# Patient Record
Sex: Female | Born: 2000 | Hispanic: No | Marital: Single | Smoking: Never smoker
Health system: Southern US, Community
[De-identification: ages and names within clinical notes are randomized; demographics above are authoritative.]

## PROBLEM LIST (undated history)

## (undated) ENCOUNTER — Emergency Department (HOSPITAL_COMMUNITY): Source: Home / Self Care

## (undated) ENCOUNTER — Inpatient Hospital Stay (HOSPITAL_COMMUNITY): Payer: Self-pay

## (undated) DIAGNOSIS — R519 Headache, unspecified: Secondary | ICD-10-CM

## (undated) DIAGNOSIS — N83209 Unspecified ovarian cyst, unspecified side: Secondary | ICD-10-CM

## (undated) HISTORY — PX: SPINE SURGERY: SHX786

## (undated) HISTORY — DX: Headache, unspecified: R51.9

---
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
## 2019-01-13 DIAGNOSIS — Q76415 Congenital kyphosis, thoracolumbar region: Secondary | ICD-10-CM | POA: Insufficient documentation

## 2020-08-16 ENCOUNTER — Other Ambulatory Visit: Payer: Medicaid Other

## 2020-08-16 DIAGNOSIS — Z20822 Contact with and (suspected) exposure to covid-19: Secondary | ICD-10-CM

## 2020-08-17 LAB — NOVEL CORONAVIRUS, NAA: SARS-CoV-2, NAA: DETECTED — AB

## 2020-08-17 LAB — SARS-COV-2, NAA 2 DAY TAT

## 2021-08-06 ENCOUNTER — Other Ambulatory Visit: Payer: Self-pay | Admitting: Nurse Practitioner

## 2021-08-07 ENCOUNTER — Encounter: Payer: Medicaid Other | Admitting: Nurse Practitioner

## 2022-06-30 ENCOUNTER — Encounter: Payer: Medicaid Other | Admitting: Radiology

## 2022-07-02 ENCOUNTER — Encounter: Payer: Self-pay | Admitting: Radiology

## 2022-07-02 ENCOUNTER — Other Ambulatory Visit (HOSPITAL_COMMUNITY)
Admission: RE | Admit: 2022-07-02 | Discharge: 2022-07-02 | Disposition: A | Discharge status: Home / Self Care | Payer: Medicaid Other | Source: Ambulatory Visit | Attending: Radiology | Admitting: Radiology

## 2022-07-02 ENCOUNTER — Ambulatory Visit (INDEPENDENT_AMBULATORY_CARE_PROVIDER_SITE_OTHER): Payer: Medicaid Other | Admitting: Radiology

## 2022-07-02 VITALS — BP 98/64 | Ht <= 58 in | Wt 130.0 lb

## 2022-07-02 DIAGNOSIS — Z01419 Encounter for gynecological examination (general) (routine) without abnormal findings: Secondary | ICD-10-CM

## 2022-07-02 DIAGNOSIS — N921 Excessive and frequent menstruation with irregular cycle: Secondary | ICD-10-CM

## 2022-07-02 DIAGNOSIS — Z308 Encounter for other contraceptive management: Secondary | ICD-10-CM

## 2022-07-02 DIAGNOSIS — Z23 Encounter for immunization: Secondary | ICD-10-CM | POA: Diagnosis not present

## 2022-07-02 DIAGNOSIS — Z113 Encounter for screening for infections with a predominantly sexual mode of transmission: Secondary | ICD-10-CM | POA: Insufficient documentation

## 2022-07-02 MED ORDER — NORETHIN-ETH ESTRAD-FE BIPHAS 1 MG-10 MCG / 10 MCG PO TABS: 1.0000 | ORAL_TABLET | Freq: Every day | ORAL | 4 refills | Status: DC

## 2022-07-02 NOTE — Progress Notes (Signed)
   Katherine Watts 10/18/2000 [id number]   History:  21 y.o. G0 presents for annual exam. C/o heavy periods every 2 weeks for the past 3 months since stopping OCPs (ran out).  Gynecologic History Patient's last menstrual period was 06/13/2022 (exact date). Period Duration (Days): 5 Period Pattern: (!) Irregular Menstrual Flow: Moderate Menstrual Control: Tampon, Maxi pad Dysmenorrhea: (!) Moderate Dysmenorrhea Symptoms: Cramping Contraception/Family planning: abstinence Sexually active: not currently   Obstetric History OB History  Gravida Para Term Preterm AB Living  0 0 0 0 0 0  SAB IAB Ectopic Multiple Live Births  0 0 0 0 0     The following portions of the patient's history were reviewed and updated as appropriate: allergies, current medications, past family history, past medical history, past social history, past surgical history, and problem list.  Review of Systems Pertinent items noted in HPI and remainder of comprehensive ROS otherwise negative.   Past medical history, past surgical history, family history and social history were all reviewed and documented in the EPIC chart.   Exam:  Vitals:   07/02/22 0808  BP: 98/64  Weight: 130 lb (59 kg)  Height: 4\' 10"  (1.473 m)   Body mass index is 27.17 kg/m.  General appearance:  Normal Thyroid:  Symmetrical, normal in size, without palpable masses or nodularity. Respiratory  Auscultation:  Clear without wheezing or rhonchi Cardiovascular  Auscultation:  Regular rate, without rubs, murmurs or gallops  Edema/varicosities:  Not grossly evident Abdominal  Soft,nontender, without masses, guarding or rebound.  Liver/spleen:  No organomegaly noted  Hernia:  None appreciated  Skin  Inspection:  Grossly normal Breasts: Examined lying and sitting.   Right: Without masses, retractions, nipple discharge or axillary adenopathy.   Left: Without masses, retractions, nipple discharge or axillary  adenopathy. Genitourinary   Inguinal/mons:  Normal without inguinal adenopathy  External genitalia:  Normal appearing vulva with no masses, tenderness, or lesions  BUS/Urethra/Skene's glands:  Normal without masses or exudate  Vagina:  Normal appearing with normal color and discharge, no lesions  Cervix:  Normal appearing without discharge or lesions  Uterus:  Normal in size, shape and contour.  Mobile, nontender  Adnexa/parametria:     Rt: Normal in size, without masses or tenderness.   Lt: Normal in size, without masses or tenderness.  Anus and perineum: Normal   Patient informed chaperone available to be present for breast and pelvic exam. Patient has requested no chaperone to be present. Patient has been advised what will be completed during breast and pelvic exam.   Assessment/Plan:   1. Well woman exam with routine gynecological exam - Cytology - PAP( [facility])  2. Screening for STDs (sexually transmitted diseases) Done with pap  3. Menometrorrhagia 4. Encounter for other contraceptive management - Norethindrone-Ethinyl Estradiol-Fe Biphas (LO LOESTRIN FE) 1 MG-10 MCG / 10 MCG tablet; Take 1 tablet by mouth daily.  Dispense: 84 tablet; Refill: 4  5. Need for immunization against influenza - Flu Vaccine QUAD 31mo+IM (Fluarix, Fluzone & Alfiuria Quad PF)     Discussed SBE, colonoscopy and DEXA screening as directed/appropriate. Recommend 5mo of exercise weekly, including weight bearing exercise. Encouraged the use of seatbelts and sunscreen. Return in 1 year for annual or as needed.   WHNP-BC 8:47 AM 07/02/2022

## 2022-07-04 LAB — CYTOLOGY - PAP
Adequacy: ABSENT
Chlamydia: NEGATIVE
Comment: NEGATIVE
Comment: NEGATIVE
Comment: NEGATIVE
Comment: NORMAL
Diagnosis: NEGATIVE
High risk HPV: NEGATIVE
Neisseria Gonorrhea: NEGATIVE
Trichomonas: NEGATIVE

## 2022-07-30 ENCOUNTER — Ambulatory Visit (HOSPITAL_COMMUNITY): Payer: Medicaid Other | Admitting: Mental Health

## 2022-09-01 ENCOUNTER — Ambulatory Visit (INDEPENDENT_AMBULATORY_CARE_PROVIDER_SITE_OTHER): Payer: Medicaid Other | Admitting: Advanced Practice Midwife

## 2022-09-01 ENCOUNTER — Encounter: Payer: Self-pay | Admitting: Advanced Practice Midwife

## 2022-09-01 VITALS — BP 93/64 | HR 82 | Ht 59.0 in | Wt 131.2 lb

## 2022-09-01 DIAGNOSIS — Z3009 Encounter for other general counseling and advice on contraception: Secondary | ICD-10-CM | POA: Diagnosis not present

## 2022-09-01 DIAGNOSIS — N939 Abnormal uterine and vaginal bleeding, unspecified: Secondary | ICD-10-CM | POA: Diagnosis not present

## 2022-09-01 MED ORDER — NORETHIN ACE-ETH ESTRAD-FE 1-20 MG-MCG PO TABS: 1.0000 | ORAL_TABLET | Freq: Every day | ORAL | 11 refills | Status: DC

## 2022-09-01 NOTE — Progress Notes (Signed)
New patient presents to est care/ complaint of irregular cycles. Pt reports that at times her periods can be very heavy with blood clots, and other times they are light. She also reports bleeding after intercourse. She states this has been happening for approx 3 years. Denies any pain or abnormal discharge during/after intercourse. Pt currently on OCPs.  PHQ - 0 GAD - 11

## 2022-09-01 NOTE — Progress Notes (Signed)
   GYNECOLOGY PROGRESS NOTE  History:  22 y.o. G0P0000 presents to [place] office today for problem gyn visit. She reports periods were regular but heavy and painful since menarche at age [age].  Then, a few months ago, she had irregular menses with period 2-3 times per month. She was prescribed LoLoestrin by her PCP and started but when she had a period 2 weeks after starting the medication, she stopped taking them. She did not resume the OCPs because she wanted to follow up and make sure that was the best thing to do. She reports painful bleeding with baseball sized clots that occur at least twice per month.  She denies h/a, dizziness, shortness of breath, n/v, or fever/chills.    The following portions of the patient's history were reviewed and updated as appropriate: allergies, current medications, past family history, past medical history, past social history, past surgical history and problem list. Last pap smear on 07/02/22 was normal.   Health Maintenance Due  Topic Date Due   HPV VACCINES (1 - 2-dose series) Never done   HIV Screening  Never done   Hepatitis C Screening  Never done   DTaP/Tdap/Td (1 - Tdap) Never done     Review of Systems:  Pertinent items are noted in HPI.   Objective:  Physical Exam Blood pressure 93/64, pulse 82, height 4\' 11"  (1.499 m), weight 131 lb 3.2 oz (59.5 kg), last menstrual period 08/18/2022. VS reviewed, nursing note reviewed,  Constitutional: well developed, well nourished, no distress HEENT: normocephalic CV: normal rate Pulm/chest wall: normal effort Breast Exam: deferred Abdomen: soft Neuro: alert and oriented x 3 Skin: warm, dry Psych: affect normal Pelvic exam: Deferred  Assessment & Plan:  1. Encounter for counseling regarding contraception --Discussed pt contraceptive plans and reviewed contraceptive methods based on pt preferences and effectiveness.  Pt prefers to restart OCPs. - norethindrone-ethinyl estradiol-FE (JUNEL FE 1/20)  1-20 MG-MCG tablet; Take 1 tablet by mouth daily.  Dispense: 28 tablet; Refill: 11  2. Abnormal uterine bleeding (AUB) --given heavy and irregular bleeding, I discussed raising the estrogen a small amount and trying a pill with a 7 day placebo period plus iron. --F/U in 3 months  - norethindrone-ethinyl estradiol-FE (JUNEL FE 1/20) 1-20 MG-MCG tablet; Take 1 tablet by mouth daily.  Dispense: 28 tablet; Refill: 11   Return in about 3 months (around 12/01/2022) for Gyn follow up for Abnormal Uterine Bleeding with me.   Fatima Blank, CNM 2:02 PM

## 2022-09-21 ENCOUNTER — Ambulatory Visit: Payer: Medicaid Other

## 2022-12-01 ENCOUNTER — Ambulatory Visit (INDEPENDENT_AMBULATORY_CARE_PROVIDER_SITE_OTHER): Payer: Medicaid Other | Admitting: Advanced Practice Midwife

## 2022-12-01 ENCOUNTER — Encounter: Payer: Self-pay | Admitting: Advanced Practice Midwife

## 2022-12-01 VITALS — BP 104/64 | HR 71 | Ht <= 58 in | Wt 132.6 lb

## 2022-12-01 DIAGNOSIS — N921 Excessive and frequent menstruation with irregular cycle: Secondary | ICD-10-CM | POA: Insufficient documentation

## 2022-12-01 DIAGNOSIS — N939 Abnormal uterine and vaginal bleeding, unspecified: Secondary | ICD-10-CM | POA: Diagnosis not present

## 2022-12-01 DIAGNOSIS — L658 Other specified nonscarring hair loss: Secondary | ICD-10-CM | POA: Insufficient documentation

## 2022-12-01 DIAGNOSIS — L659 Nonscarring hair loss, unspecified: Secondary | ICD-10-CM | POA: Diagnosis not present

## 2022-12-01 NOTE — Progress Notes (Signed)
Pt presents for AUB. Pt started Junel 3 months ago. Pt reports bleeding for 15 days with last cycle. Pt stopped BC pills 11-19-21 due prolonged bleeding. Pt reports severe cramps 1 week ago with period.

## 2022-12-01 NOTE — Progress Notes (Signed)
   GYNECOLOGY PROGRESS NOTE  History:  22 y.o. G0P0000 presents to [facility] office today for problem gyn visit. She reports AUB was improved on Junel continuous dosing, but t hen she had a 15 day period this month.  She stopped the pill on day 6 and has not restarted.  She is concerned about her hormones due to hair loss that is worsening, and bleeding sometimes with intercourse or after exercise. She denies h/a, dizziness, shortness of breath, n/v, or fever/chills.    The following portions of the patient's history were reviewed and updated as appropriate: allergies, current medications, past family history, past medical history, past social history, past surgical history and problem list. Last pap smear on 06/2022 was normal.   Health Maintenance Due  Topic Date Due   COVID-19 Vaccine (1) Never done   HPV VACCINES (1 - 2-dose series) Never done   HIV Screening  Never done   Hepatitis C Screening  Never done   DTaP/Tdap/Td (1 - Tdap) Never done     Review of Systems:  Pertinent items are noted in HPI.   Objective:  Physical Exam Blood pressure 104/64, pulse 71, height  (1.473 m), weight 132 lb 9.6 oz (60.1 kg), last menstrual period 11/14/2022. VS reviewed, nursing note reviewed,  Constitutional: well developed, well nourished, no distress HEENT: normocephalic CV: normal rate Pulm/chest wall: normal effort Breast Exam: deferred Abdomen: soft Neuro: alert and oriented x 3 Skin: warm, dry Psych: affect normal Pelvic exam: Cervix pink, visually closed, without lesion, scant white creamy discharge, vaginal walls and external genitalia normal Bimanual exam: Cervix 0/long/high, firm, anterior, neg CMT, uterus nontender, nonenlarged, adnexa without tenderness, enlargement, or mass  Assessment & Plan:  1. Alopecia --Pt concerned about hair loss and hormones.   --I recommend f/u with dermatology to look at other reasons for hair loss.  - Ambulatory referral to  Dermatology  2. Breakthrough bleeding on birth control pills --Pt prefers continuous dosing due to back pain with periods since back surgery.  With continuous dosing, she had 3 months with no menses then 15 day period in the middle of month 4.   --Discussed common finding of breakthrough bleeding with continuous dosing. Can stop the pill and start new pack during menses, or complete back and take 7 day placebo pills at the end of the back before restarting. --It may improve bleeding to have period every 3 packs, instead of long term continuous dosing.   3. Abnormal uterine bleeding (AUB) --Bleeding with exercise/intercourse  - US PELVIC COMPLETE WITH TRANSVAGINAL; Future   No follow-ups on file.   Sharen Counter, CNM 1:53 PM

## 2022-12-03 ENCOUNTER — Ambulatory Visit (HOSPITAL_COMMUNITY)
Admission: RE | Admit: 2022-12-03 | Discharge: 2022-12-03 | Disposition: A | Discharge status: Home / Self Care | Payer: Medicaid Other | Source: Ambulatory Visit | Attending: Advanced Practice Midwife | Admitting: Advanced Practice Midwife

## 2022-12-03 DIAGNOSIS — N939 Abnormal uterine and vaginal bleeding, unspecified: Secondary | ICD-10-CM | POA: Insufficient documentation

## 2023-03-26 ENCOUNTER — Ambulatory Visit
Admission: RE | Admit: 2023-03-26 | Discharge: 2023-03-26 | Disposition: A | Discharge status: Home / Self Care | Payer: Medicaid Other | Source: Ambulatory Visit | Attending: Internal Medicine | Admitting: Internal Medicine

## 2023-03-26 VITALS — BP 97/67 | HR 63 | Temp 98.1°F | Resp 18

## 2023-03-26 DIAGNOSIS — N760 Acute vaginitis: Secondary | ICD-10-CM | POA: Diagnosis present

## 2023-03-26 DIAGNOSIS — R319 Hematuria, unspecified: Secondary | ICD-10-CM | POA: Insufficient documentation

## 2023-03-26 LAB — POCT URINALYSIS DIP (MANUAL ENTRY)
Glucose, UA: NEGATIVE mg/dL
Ketones, POC UA: NEGATIVE mg/dL
Leukocytes, UA: NEGATIVE
Nitrite, UA: NEGATIVE
Protein Ur, POC: NEGATIVE mg/dL
Spec Grav, UA: >=1.03 — AB (ref 1.010–1.025)
Urobilinogen, UA: 0.2 E.U./dL
pH, UA: 6.5 (ref 5.0–8.0)

## 2023-03-26 LAB — POCT URINE PREGNANCY: Preg Test, Ur: NEGATIVE

## 2023-03-26 MED ORDER — METRONIDAZOLE 500 MG PO TABS: 500.0000 mg | ORAL_TABLET | Freq: Two times a day (BID) | ORAL | 0 refills | Status: DC

## 2023-03-26 NOTE — ED Triage Notes (Signed)
Pt presents with c/o vaginal discharge x 1 week. States the discharge is dark. Pt states she has been having migraines. Reports she tried putting in a tampon and states it was painful. Denies abd pain.

## 2023-03-26 NOTE — Discharge Instructions (Signed)
The clinic will contact you with results of the vaginal swab as well as a urine culture done today if they are positive.  Start Flagyl twice daily for 7 days to treat your vaginal discharge.  Do not drink alcohol while you are on this medication.  Please follow-up with your PCP or gynecologist if your symptoms do not improve.  Please go to the emergency room if you develop any worsening symptoms.  I hope you feel better soon!

## 2023-03-26 NOTE — ED Provider Notes (Signed)
[facility]    CSN: [id number] Arrival date & time: 03/26/23  1345      History   Chief Complaint Chief Complaint  Patient presents with   Vaginal Discharge    Entered by patient    HPI Katherine Watts is a 22 y.o. female presents for evaluation of vaginal discharge.  Patient reports a week of a dark brown malodorous discharge.  She does endorse some dysuria as well.  Denies any urinary frequency, urgency, fevers, nausea/vomiting, flank pain.  No known STD exposure but she would like screening.  She recently came off of her oral birth control and has had some increasing headaches recently these headaches do resolve with over-the-counter Excedrin Migraine.  Does report a history of reoccurring headaches over the past year.  Denies that these are the worst headaches of her life.  She tried to put in a tampon today although she is not on her menstrual cycle.  States it felt swollen.  Denies any history of BV or yeast infections.  She has not used any OTC medications for any of her symptoms.  No other concerns at this time.   Vaginal Discharge Associated symptoms: dysuria     Past Medical History:  Diagnosis Date   Kyphosis 2000/04/10    Patient Active Problem List   Diagnosis Date Noted   Breakthrough bleeding on birth control pills 12/01/2022   Abnormal uterine bleeding (AUB) 12/01/2022   Alopecia 12/01/2022    Past Surgical History:  Procedure Laterality Date   SPINE SURGERY      OB History     Gravida  0   Para  0   Term  0   Preterm  0   AB  0   Living  0      SAB  0   IAB  0   Ectopic  0   Multiple  0   Live Births  0            Home Medications    Prior to Admission medications   Medication Sig Start Date End Date Taking? Authorizing Provider  metroNIDAZOLE (FLAGYL) 500 MG tablet Take 1 tablet (500 mg total) by mouth 2 (two) times daily. 03/26/23  Yes Radford Pax, NP  norethindrone-ethinyl estradiol-FE (JUNEL FE  1/20) 1-20 MG-MCG tablet Take 1 tablet by mouth daily. Patient not taking: Reported on 12/01/2022 09/01/22   Hurshel Party, CNM    Family History History reviewed. No pertinent family history.  Social History Social History   Tobacco Use   Smoking status: Never    Passive exposure: Never   Smokeless tobacco: Never  Vaping Use   Vaping status: Never Used  Substance Use Topics   Alcohol use: Not Currently    Alcohol/week: 1.0 standard drink of alcohol    Types: 1 Standard drinks or equivalent per week    Comment: socially   Drug use: Never     Allergies   Patient has no known allergies.   Review of Systems Review of Systems  Genitourinary:  Positive for dysuria.     Physical Exam Triage Vital Signs ED Triage Vitals  Encounter Vitals Group     BP 03/26/23 1356 97/67     Systolic BP Percentile --      Diastolic BP Percentile --      Pulse Rate 03/26/23 1356 63     Resp 03/26/23 1356 18     Temp 03/26/23 1356 98.1 F (36.7 C)  Temp Source 03/26/23 1356 Oral     SpO2 03/26/23 1356 98 %     Weight --      Height --      Head Circumference --      Peak Flow --      Pain Score 03/26/23 1355 0     Pain Loc --      Pain Education --      Exclude from Growth Chart --    No data found.  Updated Vital Signs BP 97/67 (BP Location: Left Arm)   Pulse 63   Temp 98.1 F (36.7 C) (Oral)   Resp 18   LMP 03/13/2023 (Exact Date)   SpO2 98%   Visual Acuity Right Eye Distance:   Left Eye Distance:   Bilateral Distance:    Right Eye Near:   Left Eye Near:    Bilateral Near:     Physical Exam Vitals and nursing note reviewed.  Constitutional:      General: She is not in acute distress.    Appearance: Normal appearance. She is not ill-appearing, toxic-appearing or diaphoretic.  HENT:     Head: Normocephalic and atraumatic.  Eyes:     Pupils: Pupils are equal, round, and reactive to light.  Cardiovascular:     Rate and Rhythm: Normal rate.   Pulmonary:     Effort: Pulmonary effort is normal.  Abdominal:     Tenderness: There is no right CVA tenderness or left CVA tenderness.  Skin:    General: Skin is warm and dry.  Neurological:     General: No focal deficit present.     Mental Status: She is alert and oriented to person, place, and time.  Psychiatric:        Mood and Affect: Mood normal.        Behavior: Behavior normal.      UC Treatments / Results  Labs (all labs ordered are listed, but only abnormal results are displayed) Labs Reviewed  POCT URINALYSIS DIP (MANUAL ENTRY) - Abnormal; Notable for the following components:      Result Value   Clarity, UA cloudy (*)    Bilirubin, UA small (*)    Spec Grav, UA >=1.030 (*)    Blood, UA moderate (*)    All other components within normal limits  URINE CULTURE  POCT URINE PREGNANCY  CERVICOVAGINAL ANCILLARY ONLY    EKG   Radiology No results found.  Procedures Procedures (including critical care time)  Medications Ordered in UC Medications - No data to display  Initial Impression / Assessment and Plan / UC Course  I have reviewed the triage vital signs and the nursing notes.  Pertinent labs & imaging results that were available during my care of the patient were reviewed by me and considered in my medical decision making (see chart for details).     Reviewed exam and symptoms with patient.  Vaginal swab is ordered and will contact for any positive results.  Urine with some blood but otherwise no signs of UTI, will culture and contact patient with those results.  Will start Flagyl for suspected BV infection.  Advised no alcohol while on this medication.  Instructed PCP or gynecology follow-up if symptoms do not improve.  ER precautions reviewed and patient verbalized understanding. Final Clinical Impressions(s) / UC Diagnoses   Final diagnoses:  Acute vaginitis  Hematuria, unspecified type     Discharge Instructions      The clinic will contact  you with  results of the vaginal swab as well as a urine culture done today if they are positive.  Start Flagyl twice daily for 7 days to treat your vaginal discharge.  Do not drink alcohol while you are on this medication.  Please follow-up with your PCP or gynecologist if your symptoms do not improve.  Please go to the emergency room if you develop any worsening symptoms.  I hope you feel better soon!     ED Prescriptions     Medication Sig Dispense Auth. Provider   metroNIDAZOLE (FLAGYL) 500 MG tablet Take 1 tablet (500 mg total) by mouth 2 (two) times daily. 14 tablet Radford Pax, NP      PDMP not reviewed this encounter.   Radford Pax, NP 03/26/23 1432

## 2023-03-27 LAB — CERVICOVAGINAL ANCILLARY ONLY
Bacterial Vaginitis (gardnerella): POSITIVE — AB
Candida Glabrata: NEGATIVE
Candida Vaginitis: NEGATIVE
Chlamydia: POSITIVE — AB
Comment: NEGATIVE
Comment: NEGATIVE
Comment: NEGATIVE
Comment: NEGATIVE
Comment: NEGATIVE
Comment: NORMAL
Neisseria Gonorrhea: NEGATIVE
Trichomonas: NEGATIVE

## 2023-03-27 LAB — URINE CULTURE: Culture: <10000 — AB

## 2023-03-31 ENCOUNTER — Telehealth: Payer: Medicaid Other | Admitting: Emergency Medicine

## 2023-03-31 DIAGNOSIS — A749 Chlamydial infection, unspecified: Secondary | ICD-10-CM | POA: Diagnosis not present

## 2023-03-31 MED ORDER — DOXYCYCLINE HYCLATE 100 MG PO TABS: 100.0000 mg | ORAL_TABLET | Freq: Two times a day (BID) | ORAL | 0 refills | Status: AC
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
## 2023-03-31 NOTE — Patient Instructions (Signed)
  Trey Paula, thank you for joining Cathlyn Parsons, NP for today's virtual visit.  While this provider is not your primary care provider (PCP), if your PCP is located in our provider database this encounter information will be shared with them immediately following your visit.   A [facility] MyChart account gives you access to today's visit and all your visits, tests, and labs performed at [facility] " click here if you don't have a [facility] MyChart account or go to [email or web address]  Consent: (Patient) Jenisis Haskamp provided verbal consent for this virtual visit at the beginning of the encounter.  Current Medications:  Current Outpatient Medications:    doxycycline (VIBRA-TABS) 100 MG tablet, Take 1 tablet (100 mg total) by mouth 2 (two) times daily for 7 days., Disp: 14 tablet, Rfl: 0   metroNIDAZOLE (FLAGYL) 500 MG tablet, Take 1 tablet (500 mg total) by mouth 2 (two) times daily., Disp: 14 tablet, Rfl: 0   norethindrone-ethinyl estradiol-FE (JUNEL FE 1/20) 1-20 MG-MCG tablet, Take 1 tablet by mouth daily. (Patient not taking: Reported on 12/01/2022), Disp: 28 tablet, Rfl: 11   Medications ordered in this encounter:  Meds ordered this encounter  Medications   doxycycline (VIBRA-TABS) 100 MG tablet    Sig: Take 1 tablet (100 mg total) by mouth 2 (two) times daily for 7 days.    Dispense:  14 tablet    Refill:  0     *If you need refills on other medications prior to your next appointment, please contact your pharmacy*  Follow-Up: Call back or seek an in-person evaluation if the symptoms worsen or if the condition fails to improve as anticipated.  [facility] [phone]  Other Instructions 4 weeks to 3 months after finishing the antibiotics, you can be tested again to verify chlamydia is completely gone. I also strongly recommend you get tested for HIV and syphilis around 3 months after exposure.    If you have been  instructed to have an in-person evaluation today at a local Urgent Care facility, please use the link below. It will take you to a list of all of our available [facility], including address, phone number and hours of operation. Please do not delay care.  [facility]  If you or a family member do not have a primary care provider, use the link below to schedule a visit and establish care. When you choose a [facility] primary care physician or advanced practice provider, you gain a long-term partner in health. Find a Primary Care Provider  Learn more about [facility] in-office and virtual care options:  - Get Care Now

## 2023-04-08 ENCOUNTER — Ambulatory Visit: Payer: Medicaid Other | Admitting: Radiology

## 2023-04-08 VITALS — BP 98/62

## 2023-04-08 DIAGNOSIS — N760 Acute vaginitis: Secondary | ICD-10-CM | POA: Diagnosis not present

## 2023-04-08 DIAGNOSIS — T192XXA Foreign body in vulva and vagina, initial encounter: Secondary | ICD-10-CM

## 2023-04-08 DIAGNOSIS — N898 Other specified noninflammatory disorders of vagina: Secondary | ICD-10-CM

## 2023-04-08 DIAGNOSIS — Z91410 Personal history of adult physical and sexual abuse: Secondary | ICD-10-CM

## 2023-04-08 DIAGNOSIS — Z113 Encounter for screening for infections with a predominantly sexual mode of transmission: Secondary | ICD-10-CM | POA: Diagnosis not present

## 2023-04-08 DIAGNOSIS — W448XXA Other foreign body entering into or through a natural orifice, initial encounter: Secondary | ICD-10-CM

## 2023-04-08 LAB — WET PREP FOR TRICH, YEAST, CLUE

## 2023-04-08 MED ORDER — METRONIDAZOLE 500 MG PO TABS: 500.0000 mg | ORAL_TABLET | Freq: Two times a day (BID) | ORAL | 0 refills | Status: DC
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
## 2023-04-09 LAB — SURESWAB CT/NG/T. VAGINALIS
C. trachomatis RNA, TMA: DETECTED — AB
N. gonorrhoeae RNA, TMA: NOT DETECTED
Trichomonas vaginalis RNA: NOT DETECTED

## 2023-04-10 ENCOUNTER — Other Ambulatory Visit: Payer: Self-pay | Admitting: *Deleted

## 2023-04-10 LAB — HIV ANTIBODY (ROUTINE TESTING W REFLEX): HIV 1&2 Ab, 4th Generation: NONREACTIVE

## 2023-04-10 LAB — HEPATITIS C ANTIBODY: Hepatitis C Ab: NONREACTIVE

## 2023-04-10 LAB — RPR: RPR Ser Ql: NONREACTIVE

## 2023-04-10 MED ORDER — DOXYCYCLINE HYCLATE 100 MG PO TABS: 100.0000 mg | ORAL_TABLET | Freq: Two times a day (BID) | ORAL | 0 refills | Status: AC

## 2023-05-19 ENCOUNTER — Telehealth: Payer: Self-pay

## 2023-05-19 ENCOUNTER — Ambulatory Visit: Payer: Medicaid Other

## 2023-05-19 NOTE — Telephone Encounter (Signed)
Called patient to discuss appointment for Asymptomatic testing for previous STI. Last visit & testing was at [facility]. Office and specialist.  Patient remains without symptoms and will check with Gyn office prior to being seen at [facility] and follow up as needed.  B. Roten CMA

## 2023-05-25 ENCOUNTER — Encounter: Payer: Self-pay | Admitting: Emergency Medicine

## 2023-05-25 ENCOUNTER — Ambulatory Visit
Admission: EM | Admit: 2023-05-25 | Discharge: 2023-05-25 | Disposition: A | Discharge status: Home / Self Care | Payer: Medicaid Other | Attending: Emergency Medicine | Admitting: Emergency Medicine

## 2023-05-25 DIAGNOSIS — Z113 Encounter for screening for infections with a predominantly sexual mode of transmission: Secondary | ICD-10-CM | POA: Insufficient documentation

## 2023-05-25 DIAGNOSIS — Z8619 Personal history of other infectious and parasitic diseases: Secondary | ICD-10-CM | POA: Insufficient documentation

## 2023-05-25 NOTE — Discharge Instructions (Addendum)
We will call you if anything on your swab returns positive. You can also see these results on MyChart. Please abstain from sexual intercourse until your results return.
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
## 2023-05-25 NOTE — ED Triage Notes (Addendum)
Pt reports that she was seen in August for STD testing and was treated for chlamydia.  Pt wants to be tested to make everything is all good as she was vomiting every time she took the antibiotics back in August.

## 2023-05-26 LAB — CERVICOVAGINAL ANCILLARY ONLY
Chlamydia: NEGATIVE
Comment: NEGATIVE
Comment: NORMAL
Neisseria Gonorrhea: NEGATIVE

## 2023-06-09 ENCOUNTER — Ambulatory Visit: Payer: Medicaid Other | Admitting: Radiology

## 2023-06-09 VITALS — BP 100/66

## 2023-06-09 DIAGNOSIS — Z113 Encounter for screening for infections with a predominantly sexual mode of transmission: Secondary | ICD-10-CM | POA: Diagnosis not present

## 2023-06-09 DIAGNOSIS — Z202 Contact with and (suspected) exposure to infections with a predominantly sexual mode of transmission: Secondary | ICD-10-CM
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
## 2023-06-10 LAB — HIV ANTIBODY (ROUTINE TESTING W REFLEX): HIV 1&2 Ab, 4th Generation: NONREACTIVE

## 2023-06-10 LAB — SURESWAB CT/NG/T. VAGINALIS
C. trachomatis RNA, TMA: NOT DETECTED
N. gonorrhoeae RNA, TMA: NOT DETECTED
Trichomonas vaginalis RNA: NOT DETECTED

## 2023-06-10 LAB — HEPATITIS B SURFACE ANTIGEN: Hepatitis B Surface Ag: NONREACTIVE

## 2023-06-10 LAB — RPR: RPR Ser Ql: NONREACTIVE

## 2023-06-10 LAB — HEPATITIS C ANTIBODY: Hepatitis C Ab: NONREACTIVE

## 2023-07-06 ENCOUNTER — Ambulatory Visit (INDEPENDENT_AMBULATORY_CARE_PROVIDER_SITE_OTHER): Payer: Medicaid Other | Admitting: Radiology

## 2023-07-06 ENCOUNTER — Encounter: Payer: Self-pay | Admitting: Radiology

## 2023-07-06 VITALS — BP 118/76 | HR 76 | Ht <= 58 in | Wt 119.0 lb

## 2023-07-06 DIAGNOSIS — Z308 Encounter for other contraceptive management: Secondary | ICD-10-CM | POA: Diagnosis not present

## 2023-07-06 DIAGNOSIS — Z01419 Encounter for gynecological examination (general) (routine) without abnormal findings: Secondary | ICD-10-CM | POA: Diagnosis not present

## 2023-07-06 MED ORDER — TYBLUME 0.1-20 MG-MCG PO CHEW: 1.0000 | CHEWABLE_TABLET | Freq: Every day | ORAL | 4 refills | Status: DC

## 2023-07-06 NOTE — Progress Notes (Signed)
   Katherine Watts 2001/07/29 [id number]   History:  22 y.o. G0 presents for annual exam. Stopped OCPs due to irregular bleeding. Would like to restart now that her period is regular again. Using condoms for [facility]. No new partners.  Gynecologic History Patient's last menstrual period was 06/25/2023 (exact date). Period Duration (Days): 5-6 Period Pattern: Regular Menstrual Flow: Moderate Menstrual Control: Tampon Dysmenorrhea: (!) Moderate Dysmenorrhea Symptoms: Cramping Contraception/Family planning: condoms Sexually active: yes Last Pap: 2023. Results were: normal   Obstetric History OB History  Gravida Para Term Preterm AB Living  0 0 0 0 0 0  SAB IAB Ectopic Multiple Live Births  0 0 0 0 0    The following portions of the patient's history were reviewed and updated as appropriate: allergies, current medications, past family history, past medical history, past social history, past surgical history, and problem list.  ROS  Past medical history, past surgical history, family history and social history were all reviewed and documented in the EPIC chart.  Exam:  Vitals:   07/06/23 1533  BP: 118/76  Pulse: 76  SpO2: 98%  Weight: 119 lb (54 kg)  Height: 4' 9.5" (1.461 m)   Body mass index is 25.31 kg/m.  Physical Exam   Raynelle Fanning, CMA present for exam  Assessment/Plan:   1. Well woman exam with routine gynecological exam Pap 2026 Declines STI screen  2. Encounter for other contraceptive management - TYBLUME 0.1-20 MG-MCG CHEW; Chew 1 tablet by mouth daily.  Dispense: 84 tablet; Refill: 4    Discussed SBE, pap and STI screening as directed/appropriate. Recommend of exercise weekly, including weight bearing exercise. Encouraged the use of seatbelts and sunscreen.  Return in about 1 year (around 07/05/2024) for Annual.  Tanda Rockers WHNP-BC 3:57 PM 07/06/2023

## 2023-07-06 NOTE — Patient Instructions (Signed)

## 2023-07-16 ENCOUNTER — Ambulatory Visit: Payer: Medicaid Other | Admitting: Dermatology

## 2023-09-08 ENCOUNTER — Encounter: Payer: Self-pay | Admitting: Family Medicine

## 2023-09-08 ENCOUNTER — Ambulatory Visit: Payer: Medicaid Other | Admitting: Family Medicine

## 2023-09-08 VITALS — BP 90/55 | HR 79 | Temp 98.0°F | Resp 18 | Ht <= 58 in | Wt 120.9 lb

## 2023-09-08 DIAGNOSIS — N926 Irregular menstruation, unspecified: Secondary | ICD-10-CM | POA: Diagnosis not present

## 2023-09-08 DIAGNOSIS — R002 Palpitations: Secondary | ICD-10-CM | POA: Insufficient documentation

## 2023-09-08 DIAGNOSIS — Z7689 Persons encountering health services in other specified circumstances: Secondary | ICD-10-CM | POA: Insufficient documentation

## 2023-09-08 DIAGNOSIS — F419 Anxiety disorder, unspecified: Secondary | ICD-10-CM

## 2023-09-08 DIAGNOSIS — F411 Generalized anxiety disorder: Secondary | ICD-10-CM

## 2023-09-08 DIAGNOSIS — L658 Other specified nonscarring hair loss: Secondary | ICD-10-CM

## 2023-09-08 NOTE — Progress Notes (Signed)
New Patient Office Visit  Subjective    Patient ID: Katherine Watts, female    DOB: [birth year]  Age: 23 y.o. MRN: [id number]  CC:  Chief Complaint  Patient presents with   Establish Care    Patient is here to establish care with a new PCP, Patient states that she would like to discuss hair loss that she has been dealing with for the past year. She would like to discuss possibly getting labs done    HPI Nalah Macioce presents to establish care with this practice. She is new to me.   Hair loss: started one year ago, dry and thin. Worse sometimes than others. Has had bald spot in the past, not sure this is present today.  Has been seen for this in the past. Has appointment in July with derm per GYN referral.  Menstrual cycle has changed this past year.  Endorses anxiety.  Interested in labs today.  Takes biotin, zinc, vitamin D, and iron supplements.  Eating healthier, exercising more. Hair not responding.  She will stop taking all supplements before labs are drawn. Return in 2 weeks for labs.         Outpatient Encounter Medications as of 09/08/2023  Medication Sig   BIOTIN PO Take by mouth.   Ferrous Gluconate-C-Folic Acid (IRON-C PO) Take by mouth.   VITAMIN D PO Take by mouth.   ZINC OXIDE PO Take by mouth.   CALCIUM PO Take by mouth. (Patient not taking: Reported on 09/08/2023)   TYBLUME 0.1-20 MG-MCG CHEW Chew 1 tablet by mouth daily. (Patient not taking: Reported on 09/08/2023)   Vitamin D-Vitamin K (D3 + K2 PO) Take by mouth. (Patient not taking: Reported on 09/08/2023)   No facility-administered encounter medications on file as of 09/08/2023.    Past Medical History:  Diagnosis Date   Kyphosis 1999/10/04    Past Surgical History:  Procedure Laterality Date   SPINE SURGERY      History reviewed. No pertinent family history.  Social History   Socioeconomic History   Marital status: Single    Spouse name: Not on file   Number of  children: Not on file   Years of education: Not on file   Highest education level: Not on file  Occupational History   Not on file  Tobacco Use   Smoking status: Never    Passive exposure: Never   Smokeless tobacco: Never  Vaping Use   Vaping status: Never Used  Substance and Sexual Activity   Alcohol use: Not Currently    Comment: socially   Drug use: Never   Sexual activity: Yes    Partners: Male    Birth control/protection: Condom    Comment: menarche 23yo, sexual debut 23yo  Other Topics Concern   Not on file  Social History Narrative   Not on file   Social Drivers of Health   Financial Resource Strain: Not on file  Food Insecurity: Not on file  Transportation Needs: Not on file  Physical Activity: Not on file  Stress: Not on file  Social Connections: Not on file  Intimate Partner Violence: Not on file    Review of Systems  Cardiovascular:  Positive for palpitations.        Objective    BP (!) 90/55   Pulse 79   Temp 98 F (36.7 C) (Oral)   Resp 18   Ht 4\' 10"  (1.473 m)   Wt 120 lb 14.4 oz (54.8 kg)   LMP 09/05/2023 (  Exact Date)   SpO2 97%   BMI 25.27 kg/m   Physical Exam Vitals and nursing note reviewed.  Constitutional:      General: She is not in acute distress.    Appearance: Normal appearance. She is normal weight.  Cardiovascular:     Heart sounds: Normal heart sounds.  Pulmonary:     Effort: Pulmonary effort is normal.     Breath sounds: Normal breath sounds.  Skin:    General: Skin is warm and dry.     Comments: No bald patches today  Neurological:     General: No focal deficit present.     Mental Status: She is alert. Mental status is at baseline.  Psychiatric:        Mood and Affect: Mood normal.        Behavior: Behavior normal.        Thought Content: Thought content normal.        Judgment: Judgment normal.          09/08/2023    1:55 PM 09/01/2022    8:35 AM  GAD 7 : Generalized Anxiety Score  Nervous, Anxious, on  Edge 2 0  Control/stop worrying 2 3  Worry too much - different things 2 3  Trouble relaxing 2 1  Restless 3 0  Easily annoyed or irritable 3 3  Afraid - awful might happen 2 1  Total GAD 7 Score 16 11  Anxiety Difficulty Very difficult Not difficult at all      Assessment & Plan:   Problem List Items Addressed This Visit     Female pattern hair loss   Labs ordered today. Understands to return in 2 weeks for labs between 0800-1000 for best results. Stop all supplements.  Has derm appointment in July. Follow-up to be determined based on lab results.       Relevant Orders   CBC with Differential/Platelet   Comprehensive metabolic panel   Hemoglobin A1c   Iron, TIBC and Ferritin Panel   TSH+T4F+T3Free   Testosterone,Free and Total   Estrogens, total   Prolactin   Establishing care with new doctor, encounter for - Primary   Heart palpitations   Will check thyroid function. Consider addressing anxiety if labs are normal.       Relevant Orders   Comprehensive metabolic panel   NWG+N5A+[id number]   Anxiety   Thyroid function in 2 weeks after stopping biotin supplements.       Relevant Orders   TSH+T4F+T3Free   Irregular menstrual cycle   Relevant Orders   CBC with Differential/Platelet   Iron, TIBC and Ferritin Panel   GAD (generalized anxiety disorder)      09/08/2023    1:55 PM 09/01/2022    8:35 AM  GAD 7 : Generalized Anxiety Score  Nervous, Anxious, on Edge 2 0  Control/stop worrying 2 3  Worry too much - different things 2 3  Trouble relaxing 2 1  Restless 3 0  Easily annoyed or irritable 3 3  Afraid - awful might happen 2 1  Total GAD 7 Score 16 11  Anxiety Difficulty Very difficult Not difficult at all    Will continue to monitor, consider treatment if labs are normal.         Return for return in 2 weeks for labs .   Novella Olive, FNP

## 2023-09-08 NOTE — Patient Instructions (Addendum)
You have labs ordered today. Return anytime Monday-Thursday for 0800-1100 before you eat if getting your cholesterol checked, otherwise, it does not matter if you eat. We are closed daily from 1200-1300 for lunch and close at 1200 on Friday. Getting your labs done is an important part of assessing our health. If you are on My Chart you will see the labs before I do, but I will review them as soon as I can and send you a message.    Return in 2 weeks 09/22/23 for labs.  Stop all supplements now.

## 2023-09-08 NOTE — Assessment & Plan Note (Signed)
    09/08/2023    1:55 PM 09/01/2022    8:35 AM  GAD 7 : Generalized Anxiety Score  Nervous, Anxious, on Edge 2 0  Control/stop worrying 2 3  Worry too much - different things 2 3  Trouble relaxing 2 1  Restless 3 0  Easily annoyed or irritable 3 3  Afraid - awful might happen 2 1  Total GAD 7 Score 16 11  Anxiety Difficulty Very difficult Not difficult at all    Will continue to monitor, consider treatment if labs are normal.

## 2023-09-08 NOTE — Assessment & Plan Note (Addendum)
Labs ordered today. Understands to return in 2 weeks for labs between 0800-1000 for best results. Stop all supplements.  Has derm appointment in July. Follow-up to be determined based on lab results.

## 2023-09-08 NOTE — Assessment & Plan Note (Signed)
Thyroid function in 2 weeks after stopping biotin supplements.

## 2023-09-08 NOTE — Assessment & Plan Note (Signed)
Will check thyroid function. Consider addressing anxiety if labs are normal.

## 2023-09-25 ENCOUNTER — Encounter: Payer: Self-pay | Admitting: Family Medicine

## 2023-09-25 ENCOUNTER — Other Ambulatory Visit: Payer: Self-pay | Admitting: Family Medicine

## 2023-09-25 DIAGNOSIS — R7989 Other specified abnormal findings of blood chemistry: Secondary | ICD-10-CM | POA: Insufficient documentation

## 2023-09-25 LAB — IRON,TIBC AND FERRITIN PANEL
Ferritin: 25 ng/mL (ref 15–150)
Iron Saturation: 24 % (ref 15–55)
Iron: 79 ug/dL (ref 27–159)
Total Iron Binding Capacity: 335 ug/dL (ref 250–450)
UIBC: 256 ug/dL (ref 131–425)

## 2023-09-25 LAB — CBC WITH DIFFERENTIAL/PLATELET
Basophils Absolute: 0 10*3/uL (ref 0.0–0.2)
Basos: 0 %
EOS (ABSOLUTE): 0.1 10*3/uL (ref 0.0–0.4)
Eos: 1 %
Hematocrit: 41.2 % (ref 34.0–46.6)
Hemoglobin: 13.6 g/dL (ref 11.1–15.9)
Immature Grans (Abs): 0 10*3/uL (ref 0.0–0.1)
Immature Granulocytes: 0 %
Lymphocytes Absolute: 2.3 10*3/uL (ref 0.7–3.1)
Lymphs: 42 %
MCH: 29.8 pg (ref 26.6–33.0)
MCHC: 33 g/dL (ref 31.5–35.7)
MCV: 90 fL (ref 79–97)
Monocytes Absolute: 0.4 10*3/uL (ref 0.1–0.9)
Monocytes: 8 %
Neutrophils Absolute: 2.7 10*3/uL (ref 1.4–7.0)
Neutrophils: 49 %
Platelets: 231 10*3/uL (ref 150–450)
RBC: 4.57 x10E6/uL (ref 3.77–5.28)
RDW: 12.6 % (ref 11.7–15.4)
WBC: 5.5 10*3/uL (ref 3.4–10.8)

## 2023-09-25 LAB — COMPREHENSIVE METABOLIC PANEL
ALT: 12 [IU]/L (ref 0–32)
AST: 22 [IU]/L (ref 0–40)
Albumin: 4.3 g/dL (ref 4.0–5.0)
Alkaline Phosphatase: 72 [IU]/L (ref 44–121)
BUN/Creatinine Ratio: 18 (ref 9–23)
BUN: 13 mg/dL (ref 6–20)
Bilirubin Total: <0.2 mg/dL (ref 0.0–1.2)
CO2: 23 mmol/L (ref 20–29)
Calcium: 9.3 mg/dL (ref 8.7–10.2)
Chloride: 102 mmol/L (ref 96–106)
Creatinine, Ser: 0.73 mg/dL (ref 0.57–1.00)
Globulin, Total: 2.5 g/dL (ref 1.5–4.5)
Glucose: 79 mg/dL (ref 70–99)
Potassium: 4.2 mmol/L (ref 3.5–5.2)
Sodium: 140 mmol/L (ref 134–144)
Total Protein: 6.8 g/dL (ref 6.0–8.5)
eGFR: 118 mL/min/{1.73_m2} (ref >59)

## 2023-09-25 LAB — PROLACTIN: Prolactin: 34.4 ng/mL — ABNORMAL HIGH (ref 4.8–33.4)

## 2023-09-25 LAB — TSH+T4F+T3FREE
Free T4: 1.03 ng/dL (ref 0.82–1.77)
T3, Free: 3 pg/mL (ref 2.0–4.4)
TSH: 3.17 u[IU]/mL (ref 0.450–4.500)

## 2023-09-25 LAB — HEMOGLOBIN A1C
Est. average glucose Bld gHb Est-mCnc: 108 mg/dL
Hgb A1c MFr Bld: 5.4 % (ref 4.8–5.6)

## 2023-09-25 LAB — TESTOSTERONE,FREE AND TOTAL
Testosterone, Free: 1.2 pg/mL (ref 0.0–4.2)
Testosterone: 36 ng/dL (ref 13–71)

## 2023-09-25 LAB — ESTROGENS, TOTAL: Estrogen: 458 pg/mL

## 2023-10-08 ENCOUNTER — Other Ambulatory Visit: Payer: Self-pay | Admitting: Family Medicine

## 2023-10-08 ENCOUNTER — Encounter: Payer: Self-pay | Admitting: Family Medicine

## 2023-10-08 DIAGNOSIS — L658 Other specified nonscarring hair loss: Secondary | ICD-10-CM

## 2023-10-08 LAB — PROLACTIN: Prolactin: 16 ng/mL (ref 4.8–33.4)

## 2023-11-18 DIAGNOSIS — Z308 Encounter for other contraceptive management: Secondary | ICD-10-CM

## 2023-11-18 MED ORDER — TYBLUME 0.1-20 MG-MCG PO CHEW: 1.0000 | CHEWABLE_TABLET | Freq: Every day | ORAL | 3 refills | Status: DC

## 2023-11-18 NOTE — Telephone Encounter (Signed)
 Stopping her OCPs will likely cause the bleeding to continue. I would recommend she start a new pack and take 2 pills a day for the next 3 days then daily for the rest of the pack. Ibuprofen 400mg  with Tylenol 500mg  together every 4 hours.

## 2023-11-18 NOTE — Telephone Encounter (Signed)
 Spoke with patient, advised per Jami. Patient is agreeable to plan, will need new Rx, will have to fill early. Confirmed pharmacy.   Routing Rx request to Jami to review.

## 2023-11-18 NOTE — Telephone Encounter (Signed)
 Spoke with patient in f/u to [employer].  LMP 11/09/23, still spotting.  Sexually active, UPT neg 11/07/23 Pain 7 out of 10 prior to taking ibuprofen, now 5 out of 10. Nausea a few days ago, this has resolved, no vomiting. Denies fever/chills. Has stopped contraceptive for now. States she has a Hx of surgery on her lower back, states this has an effect on pain. Pain is keeping her from her daily routine to include work and gym.  States she had a similar episode almost 1 year ago and was advised to stop birth control.   Last AEX 07/06/23 PUS 12/03/22  Advised patient I will review with provider and return call with recommendations. Patient agreeable to OV today if recommended.   Jami -please review and advise.

## 2023-12-02 NOTE — Telephone Encounter (Signed)
 Follow up with PCP re: headaches. Continue to monitor cycles.

## 2023-12-03 ENCOUNTER — Encounter: Payer: Self-pay | Admitting: Family Medicine

## 2023-12-03 ENCOUNTER — Ambulatory Visit: Admitting: Family Medicine

## 2023-12-03 VITALS — BP 96/61 | HR 55 | Temp 97.7°F | Ht <= 58 in | Wt 121.0 lb

## 2023-12-03 DIAGNOSIS — R21 Rash and other nonspecific skin eruption: Secondary | ICD-10-CM | POA: Diagnosis not present

## 2023-12-03 DIAGNOSIS — R519 Headache, unspecified: Secondary | ICD-10-CM | POA: Diagnosis not present

## 2023-12-03 MED ORDER — KETOROLAC TROMETHAMINE 30 MG/ML IJ SOLN
30.0000 mg | Freq: Once | INTRAMUSCULAR | Status: AC
Start: 2023-12-03 — End: 2023-12-03
  Administered 2023-12-03: 30 mg via INTRAMUSCULAR

## 2023-12-03 MED ORDER — DEXAMETHASONE SODIUM PHOSPHATE 10 MG/ML IJ SOLN
10.0000 mg | Freq: Once | INTRAMUSCULAR | Status: AC
  Administered 2023-12-03: 10 mg via INTRAMUSCULAR

## 2023-12-03 MED ORDER — IBUPROFEN 800 MG PO TABS: 800.0000 mg | ORAL_TABLET | Freq: Three times a day (TID) | ORAL | 0 refills | Status: DC | PRN

## 2023-12-03 NOTE — Assessment & Plan Note (Signed)
 Headache present for 9 days. Has only taken ibuprofen  once with some relief. No red flag symptoms today. Neurological exam normal. Toradol  30 mg and dexamethasone  10 mg IM. In 24 hours start ibuprofen  800 mg Q 8 hours with food if needed. Symptoms reviewed that warrant seeking higher level of care. Referral to neuro for evaluation. Follow-up if needed with PCP.

## 2023-12-03 NOTE — Progress Notes (Signed)
 Acute Office Visit  Subjective:     Patient ID: Katherine Watts, female    DOB: 12-10-2000, 23 y.o.   MRN: [id number]  Chief Complaint  Patient presents with   Headache    Pt report  constant severe headache ranging between 7/8, she has blurry vision and nausea onset for 14 days, pt would like to discus a rash onset 1 mo, current characteristics includes": pink at times slightly raised hot to touch, brown when not inflamed, sweating triggers the rash.    HPI Patient is in today for headache and rash.  Headache:  Systemic symptoms such as fever or chills: none History of cancer: no Neurological changes: blurred vision. "Feels like eyes are super watery" worse on right eye.  Sudden onset: gradual onset 9 days ago, getting worse Worse headache ever: yes, started getting headaches 2 months ago.  > 63 years of age: n/a Change in usual headache: yes, affecting vision  Postural change in headache: yes, gets dizziness. Able to work-out through the headache. Headache caused after sneezing, coughing, or exercise: makes it worse, not caused by exercise or coughing or sneezing Pregnancy: negative preg last week Painful eye: none  Visual changes: blurry  Post-trauma: no Unilateral: behind eyes, on both sides Nausea or vomiting: nausea no vomiting  Photophobia: yes Phonophobia: yes Has not taken anything until yesterday. Ibuprofen  helped, pressure remained.  Rash: Present for one month. Has derm referral in place. No itchiness. Red. No changes in diet or activities. No respiratory/swallowing changes.     Review of Systems  Skin:  Positive for rash. Negative for itching.  Neurological:  Positive for headaches. Negative for sensory change, speech change and weakness.        Objective:    BP 96/61   Pulse (!) 55   Temp 97.7 F (36.5 C) (Oral)   Ht 4\' 9"  (1.448 m)   Wt 121 lb (54.9 kg)   SpO2 100%   BMI 26.18 kg/m    Physical Exam Vitals and nursing note  reviewed.  Constitutional:      General: She is not in acute distress.    Appearance: Normal appearance. She is not ill-appearing.  Cardiovascular:     Rate and Rhythm: Normal rate and regular rhythm.     Heart sounds: Normal heart sounds.  Pulmonary:     Effort: Pulmonary effort is normal.     Breath sounds: Normal breath sounds.  Skin:    General: Skin is warm and dry.     Comments: Slightly raised erythematous area on low back. Not pruritic.   Neurological:     General: No focal deficit present.     Mental Status: She is alert. Mental status is at baseline.     GCS: GCS eye subscore is 4. GCS verbal subscore is 5. GCS motor subscore is 6.     Cranial Nerves: Cranial nerves 2-12 are intact.     Sensory: Sensation is intact.     Motor: Motor function is intact.     Coordination: Coordination is intact.     Gait: Gait is intact.     Comments: Alert, communicative with normal speech. PERRLA, vision grossly intact. EOM normal, no nystagmus. Smile symmetric. Uvula midline, swallowing intact, tongue midline and able to move side to side. Clinches jaw, puffs cheeks, closes eyes tightly, raises eyebrows. Hearing grossly intact. Moves head side to side against resistance. Shrugs shoulders against resistance.  5/5 upper and lower extremity strength. Ambulates with coordinated gait. Finger to  nose normal. Rapid alternating hands normal. Heel to shin normal. Romberg negative.    Psychiatric:        Mood and Affect: Mood normal.        Behavior: Behavior normal.        Thought Content: Thought content normal.        Judgment: Judgment normal.    No results found for any visits on 12/03/23.      Assessment & Plan:   Acute intractable headache, unspecified headache type Assessment & Plan: Headache present for 9 days. Has only taken ibuprofen  once with some relief. No red flag symptoms today. Neurological exam normal. Toradol  30 mg and dexamethasone  10 mg IM. In 24  hours start ibuprofen  800 mg Q 8 hours with food if needed. Symptoms reviewed that warrant seeking higher level of care. Referral to neuro for evaluation. Follow-up if needed with PCP.    Orders: -     Ketorolac  Tromethamine  -     dexAMETHasone  Sodium Phosphate  -     Ambulatory referral to Neurology -     Ibuprofen ; Take 1 tablet (800 mg total) by mouth every 8 (eight) hours as needed for moderate pain (pain score 4-6). Take with food.  Dispense: 30 tablet; Refill: 0  Rash and other nonspecific skin eruption Assessment & Plan: Slightly raised erythematous rash on low back present for one month. Not pruritic.  Has derm appointment scheduled in October. Dexamethasone  for headache will improve this rash. Follow-up if symptoms worsen.        Meds ordered this encounter  Medications   ketorolac  (TORADOL ) 30 MG/ML injection 30 mg   dexamethasone  (DECADRON ) injection 10 mg   ibuprofen  (ADVIL ) 800 MG tablet    Sig: Take 1 tablet (800 mg total) by mouth every 8 (eight) hours as needed for moderate pain (pain score 4-6). Take with food.    Dispense:  30 tablet    Refill:  0    Supervising Provider:   RUCKER, ALETHEA Y [[id number]]  Agrees with plan of care discussed.  Questions answered.   Return if symptoms worsen or fail to improve.  Mickiel Albany, FNP

## 2023-12-03 NOTE — Patient Instructions (Signed)
 Start taking ibuprofen  tomorrow around 1000. If your symptoms worsen, go to the ED for evaluation.

## 2023-12-03 NOTE — Assessment & Plan Note (Signed)
 Slightly raised erythematous rash on low back present for one month. Not pruritic.  Has derm appointment scheduled in October. Dexamethasone  for headache will improve this rash. Follow-up if symptoms worsen.

## 2023-12-04 ENCOUNTER — Encounter (HOSPITAL_COMMUNITY): Payer: Self-pay | Admitting: *Deleted

## 2023-12-04 ENCOUNTER — Other Ambulatory Visit: Payer: Self-pay

## 2023-12-04 ENCOUNTER — Encounter: Payer: Self-pay | Admitting: Family Medicine

## 2023-12-04 ENCOUNTER — Emergency Department (HOSPITAL_COMMUNITY)
Admission: EM | Admit: 2023-12-04 | Discharge: 2023-12-05 | Disposition: A | Discharge status: Home / Self Care | Attending: Emergency Medicine | Admitting: Emergency Medicine

## 2023-12-04 DIAGNOSIS — L42 Pityriasis rosea: Secondary | ICD-10-CM | POA: Insufficient documentation

## 2023-12-04 DIAGNOSIS — E876 Hypokalemia: Secondary | ICD-10-CM | POA: Diagnosis not present

## 2023-12-04 DIAGNOSIS — D72829 Elevated white blood cell count, unspecified: Secondary | ICD-10-CM | POA: Insufficient documentation

## 2023-12-04 DIAGNOSIS — R519 Headache, unspecified: Secondary | ICD-10-CM | POA: Diagnosis present

## 2023-12-04 NOTE — ED Triage Notes (Signed)
 The pt has multiple complaints migrane headache with some blurred vision  also a rash on her back for one month  lmp April 16  on her period for 16 days

## 2023-12-05 ENCOUNTER — Emergency Department (HOSPITAL_COMMUNITY)

## 2023-12-05 LAB — HCG, SERUM, QUALITATIVE: Preg, Serum: NEGATIVE

## 2023-12-05 LAB — URINALYSIS, ROUTINE W REFLEX MICROSCOPIC
Bilirubin Urine: NEGATIVE
Glucose, UA: NEGATIVE mg/dL
Hgb urine dipstick: NEGATIVE
Ketones, ur: NEGATIVE mg/dL
Leukocytes,Ua: NEGATIVE
Nitrite: NEGATIVE
Protein, ur: NEGATIVE mg/dL
Specific Gravity, Urine: 1.023 (ref 1.005–1.030)
pH: 5 (ref 5.0–8.0)

## 2023-12-05 LAB — COMPREHENSIVE METABOLIC PANEL WITH GFR
ALT: 15 U/L (ref 0–44)
AST: 22 U/L (ref 15–41)
Albumin: 3.4 g/dL — ABNORMAL LOW (ref 3.5–5.0)
Alkaline Phosphatase: 41 U/L (ref 38–126)
Anion gap: 11 (ref 5–15)
BUN: 15 mg/dL (ref 6–20)
CO2: 22 mmol/L (ref 22–32)
Calcium: 8.7 mg/dL — ABNORMAL LOW (ref 8.9–10.3)
Chloride: 105 mmol/L (ref 98–111)
Creatinine, Ser: 0.94 mg/dL (ref 0.44–1.00)
GFR, Estimated: >60 mL/min (ref >60)
Glucose, Bld: 91 mg/dL (ref 70–99)
Potassium: 3.4 mmol/L — ABNORMAL LOW (ref 3.5–5.1)
Sodium: 138 mmol/L (ref 135–145)
Total Bilirubin: 0.2 mg/dL (ref 0.0–1.2)
Total Protein: 6.3 g/dL — ABNORMAL LOW (ref 6.5–8.1)

## 2023-12-05 LAB — CBC
HCT: 36.8 % (ref 36.0–46.0)
Hemoglobin: 12.2 g/dL (ref 12.0–15.0)
MCH: 30.3 pg (ref 26.0–34.0)
MCHC: 33.2 g/dL (ref 30.0–36.0)
MCV: 91.5 fL (ref 80.0–100.0)
Platelets: 218 10*3/uL (ref 150–400)
RBC: 4.02 MIL/uL (ref 3.87–5.11)
RDW: 12.7 % (ref 11.5–15.5)
WBC: 16.1 10*3/uL — ABNORMAL HIGH (ref 4.0–10.5)
nRBC: 0 % (ref 0.0–0.2)

## 2023-12-05 MED ORDER — METOCLOPRAMIDE HCL 10 MG PO TABS
10.0000 mg | ORAL_TABLET | Freq: Once | ORAL | Status: AC
  Administered 2023-12-05: 10 mg via ORAL
  Filled 2023-12-05: qty 1

## 2023-12-05 MED ORDER — BUTALBITAL-APAP-CAFFEINE 50-325-40 MG PO TABS
1.0000 | ORAL_TABLET | Freq: Once | ORAL | Status: AC
  Administered 2023-12-05: 1 via ORAL
  Filled 2023-12-05: qty 1

## 2023-12-05 NOTE — Discharge Instructions (Addendum)
 You are seen in the ER today for your headache.  Your exam was reassuring as was your CT scan.  You may alternate Tylenol  ibuprofen  as needed, you may follow-up with the neurologist listed below.  Regarding your rash this is likely a postviral rash which will resolve itself in 8 to 12 weeks.  You may use over-the-counter antihistamines as needed for itchiness.  Follow-up with your primary care doctor return to the ER with any new severe symptoms.

## 2023-12-05 NOTE — ED Provider Notes (Signed)
 [place] [facility] AT [facility] Provider Note   CSN: [id number] Arrival date & time: 12/04/23  2314     History  Chief Complaint  Patient presents with   multiple complaints    Katherine Watts is a 23 y.o. female who presents with non-intractable headache for the last several days behind the eyes with associated blurry vision.  Additionally has rash for the last month on her back that is intermittently itchy, started with 1 large patch in her right back and now is covering her entire back.  No known ill contacts.  Has taken a single dose of ibuprofen  in the last 24 hours for headache with some improvement though this resolved.  Was seen at her primary care doctor yesterday and received a Decadron  and Toradol  injections for headache with temporary improvement.  Headache has been persistent for several days without relief.  Was not sudden onset.  I personally reviewed medical records.  History of congenital scoliosis status post surgical repair, generalized anxiety disorder.  No history of migraines in the past, no aura, not currently on OCPs.  HPI     Home Medications Prior to Admission medications   Medication Sig Start Date End Date Taking? Authorizing Provider  BIOTIN PO Take by mouth. Patient not taking: Reported on 12/03/2023    [provider]  CALCIUM PO Take by mouth. Patient not taking: Reported on 12/03/2023    [provider]  Ferrous Gluconate-C-Folic Acid (IRON-C PO) Take by mouth. Patient not taking: Reported on 12/03/2023    [provider]  ibuprofen  (ADVIL ) 800 MG tablet Take 1 tablet (800 mg total) by mouth every 8 (eight) hours as needed for moderate pain (pain score 4-6). Take with food. 12/03/23   Mickiel Albany, FNP  TYBLUME  0.1-20 MG-MCG CHEW Chew 1 tablet by mouth daily. Patient not taking: Reported on 12/03/2023 11/18/23   Chrzanowski, Jami B, NP  VITAMIN D PO Take by mouth. Patient not taking: Reported  on 12/03/2023    [provider]  Vitamin D-Vitamin K (D3 + K2 PO) Take by mouth. Patient not taking: Reported on 12/03/2023    [provider]  ZINC OXIDE PO Take by mouth. Patient not taking: Reported on 12/03/2023    [provider]      Allergies    Patient has no known allergies.    Review of Systems   Review of Systems  Constitutional: Negative.   HENT: Negative.    Eyes:  Negative for pain, discharge, itching and visual disturbance.  Respiratory: Negative.    Cardiovascular: Negative.   Gastrointestinal: Negative.   Genitourinary: Negative.   Musculoskeletal: Negative.   Skin:  Positive for rash.  Neurological:  Positive for headaches. Negative for dizziness, light-headedness and numbness.    Physical Exam Updated Vital Signs BP (!) 102/56 (BP Location: Right Arm)   Pulse 62   Temp 98.8 F (37.1 C) (Oral)   Resp 16   Ht 4\' 9"  (1.448 m)   Wt 54.9 kg   SpO2 100%   BMI 26.19 kg/m  Physical Exam Vitals and nursing note reviewed.  HENT:     Head: Normocephalic and atraumatic.     Mouth/Throat:     Mouth: Mucous membranes are moist.     Pharynx: No oropharyngeal exudate or posterior oropharyngeal erythema.  Eyes:     General: Lids are normal.        Right eye: No discharge.        Left  eye: No discharge.     Extraocular Movements: Extraocular movements intact.     Conjunctiva/sclera: Conjunctivae normal.     Pupils: Pupils are equal, round, and reactive to light.  Cardiovascular:     Rate and Rhythm: Normal rate and regular rhythm.     Pulses: Normal pulses.     Heart sounds: Normal heart sounds. No murmur heard. Pulmonary:     Effort: Pulmonary effort is normal. No respiratory distress.     Breath sounds: Normal breath sounds. No wheezing or rales.  Abdominal:     General: Bowel sounds are normal. There is no distension.     Palpations: Abdomen is soft.     Tenderness: There is no abdominal tenderness. There is no guarding or  rebound.  Musculoskeletal:        General: No deformity.     Cervical back: Neck supple.  Skin:    General: Skin is warm and dry.       Neurological:     Mental Status: She is alert. Mental status is at baseline.     GCS: GCS eye subscore is 4. GCS verbal subscore is 5. GCS motor subscore is 6.     Cranial Nerves: Cranial nerves 2-12 are intact.     Sensory: Sensation is intact.     Motor: Motor function is intact.     Coordination: Coordination is intact.     Gait: Gait is intact.  Psychiatric:        Mood and Affect: Mood normal.     ED Results / Procedures / Treatments   Labs (all labs ordered are listed, but only abnormal results are displayed) Labs Reviewed  COMPREHENSIVE METABOLIC PANEL WITH GFR - Abnormal; Notable for the following components:      Result Value   Potassium 3.4 (*)    Calcium 8.7 (*)    Total Protein 6.3 (*)    Albumin 3.4 (*)    All other components within normal limits  CBC - Abnormal; Notable for the following components:   WBC 16.1 (*)    All other components within normal limits  URINALYSIS, ROUTINE W REFLEX MICROSCOPIC  HCG, SERUM, QUALITATIVE    EKG None  Radiology CT HEAD WO CONTRAST ( ) Result Date: 12/05/2023 CLINICAL DATA:  Headache, sudden, severe EXAM: CT HEAD WITHOUT CONTRAST TECHNIQUE: Contiguous axial images were obtained from the base of the skull through the vertex without intravenous contrast. RADIATION DOSE REDUCTION: This exam was performed according to the departmental dose-optimization program which includes automated exposure control, adjustment of the mA and/or kV according to patient size and/or use of iterative reconstruction technique. COMPARISON:  None Available. FINDINGS: Brain: Normal anatomic configuration. No abnormal intra or extra-axial mass lesion or fluid collection. No abnormal mass effect or midline shift. No evidence of acute intracranial hemorrhage or infarct. Ventricular size is normal. Cerebellum  unremarkable. Vascular: Unremarkable Skull: Intact Sinuses/Orbits: Paranasal sinuses are clear. Orbits are unremarkable. Other: Mastoid air cells and middle ear cavities are clear. IMPRESSION: 1. Normal noncontrast CT of the head. Electronically Signed   By: Worthy Heads M.D.   On: 12/05/2023 04:06    Procedures Procedures    Medications Ordered in ED Medications  metoCLOPramide (REGLAN) tablet 10 mg (10 mg Oral Given 12/05/23 0409)  butalbital-acetaminophen -caffeine (FIORICET) 50-325-40 MG per tablet 1 tablet (1 tablet Oral Given 12/05/23 0409)    ED Course/ Medical Decision Making/ A&P  Medical Decision Making 23 year old female presents with concern for headache.  No red flag symptoms.  Normal vitals on intake.  Cardiopulmonary unremarkable, abdominal exam is benign, normal neurologic exam.  Macular patchy rash on the entire back, erythematous but not raised, nontender, no Nikolsky sign.  Amount and/or Complexity of Data Reviewed Labs:     Details: Leukocytosis of 16,000, CMP with mild hypokalemia of 3.4.  Pregnancy test negative, UA unremarkable, Radiology: ordered.    Details:  CT head unremarkable.    Risk Prescription drug management.   Patient remains neurovascularly intact,, hemodynamically stable with improvement in headache following migraine cocktail.  Clinical concern ruminant underlying condition that would warrant further ED workup and patient management is exceedingly low.  May follow-up in the outpatient setting for her headache.  May use OTC analgesia if needed.  Regarding her rash clinically is most consistent with pityriasis rosea and will be self-limiting.  May use over-the-counter antihistamines as needed for pruritus.  Mayella  voiced understanding of her medical evaluation and treatment plan. Each of their questions answered to their expressed satisfaction.  Return precautions were given.  Patient is well-appearing, stable, and  was discharged in good condition.  This chart was dictated using voice recognition software, Dragon. Despite the best efforts of this provider to proofread and correct errors, errors may still occur which can change documentation meaning.         Final Clinical Impression(s) / ED Diagnoses Final diagnoses:  Pityriasis rosea  Acute nonintractable headache, unspecified headache type    Rx / DC Orders ED Discharge Orders     None         Arlyne Lame 12/05/23 0706    Lindle Rhea, MD 12/05/23 [phone]

## 2023-12-06 ENCOUNTER — Other Ambulatory Visit: Payer: Self-pay | Admitting: Family Medicine

## 2023-12-06 DIAGNOSIS — H538 Other visual disturbances: Secondary | ICD-10-CM | POA: Insufficient documentation

## 2023-12-08 ENCOUNTER — Ambulatory Visit: Admitting: Neurology

## 2023-12-08 ENCOUNTER — Encounter: Payer: Self-pay | Admitting: Neurology

## 2023-12-08 VITALS — BP 106/72 | HR 82 | Ht <= 58 in | Wt 122.0 lb

## 2023-12-08 DIAGNOSIS — R202 Paresthesia of skin: Secondary | ICD-10-CM | POA: Diagnosis not present

## 2023-12-08 DIAGNOSIS — H538 Other visual disturbances: Secondary | ICD-10-CM

## 2023-12-08 DIAGNOSIS — G43909 Migraine, unspecified, not intractable, without status migrainosus: Secondary | ICD-10-CM | POA: Diagnosis not present

## 2023-12-08 DIAGNOSIS — R519 Headache, unspecified: Secondary | ICD-10-CM

## 2023-12-08 DIAGNOSIS — Z9189 Other specified personal risk factors, not elsewhere classified: Secondary | ICD-10-CM | POA: Diagnosis not present

## 2023-12-08 MED ORDER — RIZATRIPTAN BENZOATE 5 MG PO TABS: 5.0000 mg | ORAL_TABLET | ORAL | 1 refills | Status: DC | PRN

## 2023-12-08 NOTE — Patient Instructions (Addendum)
 It was nice to meet you today.   As discussed, your headaches are likely due to a combination of factors.   Here is what we discussed today and my recommendations for you:   Please remember, common headache triggers are: sleep deprivation, dehydration, overheating, stress, hypoglycemia or skipping meals and blood sugar fluctuations, excessive pain medications or excessive alcohol use or caffeine withdrawal. Some people have food triggers such as aged cheese, orange juice or chocolate, especially dark chocolate, or MSG (monosodium glutamate). Try to avoid these headache triggers as much possible. It may be helpful to keep a headache diary to figure out what makes your headaches worse or brings them on and what alleviates them. Some people report headache onset after exercise but studies have shown that regular exercise may actually prevent headaches from coming. If you have exercise-induced headaches, please make sure that you drink plenty of fluid before and after exercising and that you do not over do it and do not overheat. Please reduce and eliminate caffeine consumption for now as caffeine may perpetuate your headaches.   Continue to hydrate well with water, 64 ounces per day are recommended, spread throughout the day.   For as needed use for acute migraines, I recommend you try Maxalt (generic name: Rizatriptan), 5 mg: take 1 pill early on when you suspect a migraine attack come on. You may take another pill after 2 hours, no more than 2 pills in 24 hours. Most people who take triptans do not have any serious side-effects. However, they can cause drowsiness (remember to not drive or use heavy machinery when drowsy), nausea, dizziness, dry mouth. Less common side effects include strange sensations, such as tightness in your chest or throat, tingling, flushing, and feelings of heaviness or pressure in areas such as the face, limbs, and chest. These in the chest can mimic heart related pain (angina) and  may cause alarm, but usually these sensations are not harmful or a sign of a heart attack. However, if you develop intense chest pain or sensations of discomfort, you should stop taking your medication and consult with me or your PCP or go to the nearest urgent care facility or ER or call 911.  We will do a brain scan, called MRI and call you with the test results. We will have to schedule you for this on a separate date. This test requires authorization from your insurance, and we will take care of the insurance process. I will order a home sleep test to look for signs of obstructive sleep apnea (aka OSA). As explained, the long-term risks and ramifications of untreated moderate to severe obstructive sleep apnea may include (but are not limited to): increased risk for cardiovascular disease, including congestive heart failure, stroke, difficult to control hypertension, treatment resistant obesity, arrhythmias, especially irregular heartbeat commonly known as A. Fib. (atrial fibrillation); even type 2 diabetes has been linked to untreated OSA.  I recommend you see an optometrist or ophthalmologist of your choosing for a full, dilated eye examination as you have had blurry vision and light sensitivity in the right eye more than left.  Strain on the eyes can cause headaches as well. If you have any acute or new symptoms that are worrisome, please proceed to the emergency room or call 911. Keep your appointment with your dermatologist as scheduled. We will plan a follow up in about 3 months.

## 2023-12-08 NOTE — Progress Notes (Signed)
 Subjective:    Patient ID: Katherine Watts is a 23 y.o. female.  HPI    Katherine Fairy, MD, PhD [facility] [address] [address] [place], [place] [address]  Dear Katherine Watts,  I saw your patient, Katherine Watts, upon your kind request in myneurologic clinic today for initial consultation of her recurrent headaches.  The patient is unaccompanied today.  As you know, Katherine Watts is a 23 year old female with a benign medical history, who reports a recurrent headaches for the past 10 days.  She is currently taking ibuprofen  800 mg strength as needed.  Typically once a day, typically in the mornings after she eats something.  She was told not to take it on an empty stomach.  She has woken up with a headache.  She describes the headache as achy, currently a 4-5 out of 10, around her eyes and behind her eyes, associated with blurry vision.  She has not had an eye examination, has not scheduled an eye exam yet but has scheduled an appointment with dermatology for July for a rash on her back.  She has had intermittent numbness and tingling in her right face, this has been independent of her headaches recently, this has been going on intermittently for 2 to 3 months, she has had some intermittent tingling in her wrists and ankle areas bilaterally as well.  She has not fallen, she has not had any sudden onset one-sided weakness.  She has not had any double vision or complete loss of vision thankfully.  She feels somewhat light sensitive on the right side.  She wonders if she has astigmatism.  She is not aware of any family history of migraine headaches.  She feels that she sleeps fairly well.  She tries to hydrate well with water, drinks caffeine in the form of energy drinks and soda, about 3-4 times a week on average.  She drinks alcohol about once or twice a month, she is a non-smoker, does not utilize any THC products.  She has had some associated nausea  with her headache.  I reviewed your office note from 12/03/2023.  She received a Toradol  and Decadron  injection at the time.  She was given a prescription for ibuprofen  800 mg strength as needed.  She presented to the emergency room on 12/05/2023 with a headache.  I reviewed the emergency room records.  She had a head CT without contrast on 12/05/2023 and I reviewed the results: Impression: Normal noncontrast CT of the head.   In addition, I personally and independently reviewed images through the PACS system.  Mild deviated septum is noted. I reviewed the emergency room records.  She presented with multiple complaints including headache for the past several days, associated blurry vision,  and rash.  She was noted to have mild hypokalemia of 3.4, leukocytosis of 16,000, pregnancy test negative.  She was treated symptomatically with Fioricet orally and metoclopramide.  Her Epworth sleepiness score is 0 out of 24, fatigue severity score is 13 out of 63.   Her Past Medical History Is Significant For: Past Medical History:  Diagnosis Date   Headache    Kyphosis 12/23/1999    Her Past Surgical History Is Significant For: Past Surgical History:  Procedure Laterality Date   SPINE SURGERY      Her Family History Is Significant For: Family History  Problem Relation Age of Onset   Seizures Sister    Headache Neg Hx    Migraines Neg Hx  Her Social History Is Significant For: Social History   Socioeconomic History   Marital status: Single    Spouse name: Not on file   Number of children: Not on file   Years of education: Not on file   Highest education level: Not on file  Occupational History   Not on file  Tobacco Use   Smoking status: Never    Passive exposure: Never   Smokeless tobacco: Never  Vaping Use   Vaping status: Never Used  Substance and Sexual Activity   Alcohol use: Not Currently    Comment: socially   Drug use: Never   Sexual activity: Yes    Partners: Male     Birth control/protection: Condom    Comment: menarche 23yo, sexual debut 23yo  Other Topics Concern   Not on file  Social History Narrative   Not on file   Social Drivers of Health   Financial Resource Strain: Not on file  Food Insecurity: Not on file  Transportation Needs: Not on file  Physical Activity: Not on file  Stress: Not on file  Social Connections: Not on file    Her Allergies Are:  No Known Allergies:   Her Current Medications Are:  Outpatient Encounter Medications as of 12/08/2023  Medication Sig   ibuprofen  (ADVIL ) 800 MG tablet Take 1 tablet (800 mg total) by mouth every 8 (eight) hours as needed for moderate pain (pain score 4-6). Take with food.   BIOTIN PO Take by mouth. (Patient not taking: Reported on 12/08/2023)   CALCIUM PO Take by mouth. (Patient not taking: Reported on 09/08/2023)   Ferrous Gluconate-C-Folic Acid (IRON-C PO) Take by mouth. (Patient not taking: Reported on 12/08/2023)   TYBLUME  0.1-20 MG-MCG CHEW Chew 1 tablet by mouth daily. (Patient not taking: Reported on 12/03/2023)   VITAMIN D PO Take by mouth. (Patient not taking: Reported on 12/08/2023)   Vitamin D-Vitamin K (D3 + K2 PO) Take by mouth. (Patient not taking: Reported on 09/08/2023)   ZINC OXIDE PO Take by mouth. (Patient not taking: Reported on 12/08/2023)   No facility-administered encounter medications on file as of 12/08/2023.  :   Review of Systems:  Out of a complete 14 point review of systems, all are reviewed and negative with the exception of these symptoms as listed below:  Review of Systems  Neurological:        Room 5 Pt is here Alone. Pt states she had a headache that was ongoing for 10 days straight. Pt states that her headaches will last 1 hour or more. Pt states that her headaches will give her blurry vision sometimes. Pt states she is having some tingling numbness on her right side from her eye down her whole right side along with a rash.  ESS 0 FSS 13     Objective:   Neurological Exam  Physical Exam Physical Examination:   Vitals:   12/08/23 1051  BP: 106/72  Pulse: 82    General Examination: The patient is a very pleasant 23 y.o. female in no acute distress. She appears well-developed and well-nourished and well groomed.   HEENT: Normocephalic, atraumatic, pupils are equal, round and reactive to light, no RAPD. Funduscopic exam benign, mild photophobia noted, right more than left.  Right side perceives light more bright as compared to left. Extraocular tracking is good without limitation to gaze excursion or nystagmus noted. Hearing is grossly intact. Face is symmetric with normal facial animation and normal facial sensation to light touch, temperature  and vibration sense. Speech is clear with no dysarthria noted. There is no hypophonia. There is no lip, neck/head, jaw or voice tremor. Neck is supple with full range of passive and active motion. There are no carotid bruits on auscultation. Oropharynx exam reveals: mild mouth dryness, good dental hygiene and mild airway crowding, due to small airway entry.  Tongue protrudes centrally and palate elevates symmetrically.  Chest: Clear to auscultation without wheezing, rhonchi or crackles noted.  Heart: S1+S2+0, regular and normal without murmurs, rubs or gallops noted.   Abdomen: Soft, non-tender and non-distended.  Extremities: There is no pitting edema in the distal lower extremities bilaterally.   Skin: Warm and dry without trophic changes noted in the visible portions of her skin in the upper and lower extremities distally.   Musculoskeletal: exam reveals no obvious joint deformities.   Neurologically:  Mental status: The patient is awake, alert and oriented in all 4 spheres. Her immediate and remote memory, attention, language skills and fund of knowledge are appropriate. There is no evidence of aphasia, agnosia, apraxia or anomia. Speech is clear with normal prosody and enunciation. Thought  process is linear. Mood is normal and affect is normal.  Cranial nerves II - XII are as described above under HEENT exam.  Motor exam: Normal bulk, strength and tone is noted. There is no obvious action or resting tremor.  No drift or rebound, no postural or intention tremor. Fine motor skills and coordination: Intact finger taps, hand movements and rapid alternating patting with both upper extremities, normal foot taps bilaterally in the lower extremities. Romberg negative. Reflexes 2+ throughout, toes are downgoing bilaterally. Cerebellar testing: No dysmetria or intention tremor. There is no truncal or gait ataxia.  Normal finger-to-nose, normal heel-to-shin bilaterally. Sensory exam: intact to light touch, temperature and vibration sense in the upper and lower extremities.  Gait, station and balance: She stands easily. No veering to one side is noted. No leaning to one side is noted. Posture is age-appropriate and stance is narrow based. Gait shows normal stride length and normal pace. No problems turning are noted.  Normal tandem walk.  Assessment and plan:   In summary, Mardella Rieder is a very pleasant 23 y.o.-year old female with a benign medical history, who presents for evaluation of her recent onset of recurrent headaches.  Description is not very classic for migraines, she may have more mixed type of headache including strain on the eyes, caffeine related headaches, underlying sleep disordered breathing not excluded.  I had a long discussion with the patient regarding headache triggers, alleviating factors, medication options for as needed use.  We talked about additional diagnostic testing as well today.  This was an extended visit of over 60 minutes with copious record review involved, high complexity because of addressing multiple potential underlying issues and considerable counseling and coordination of care.    Below is a summary of my recommendations and our discussion  points from today's visit.  She was given these instructions and recommendations verbally during the visit and also in her MyChart after visit summary which she can access electronically:  << Please remember, common headache triggers are: sleep deprivation, dehydration, overheating, stress, hypoglycemia or skipping meals and blood sugar fluctuations, excessive pain medications or excessive alcohol use or caffeine withdrawal. Some people have food triggers such as aged cheese, orange juice or chocolate, especially dark chocolate, or MSG (monosodium glutamate). Try to avoid these headache triggers as much possible. It may be helpful to keep a headache diary  to figure out what makes your headaches worse or brings them on and what alleviates them. Some people report headache onset after exercise but studies have shown that regular exercise may actually prevent headaches from coming. If you have exercise-induced headaches, please make sure that you drink plenty of fluid before and after exercising and that you do not over do it and do not overheat. Please reduce and eliminate caffeine consumption for now as caffeine may perpetuate your headaches.   Continue to hydrate well with water, 64 ounces per day are recommended, spread throughout the day.   For as needed use for acute migraines, I recommend you try Maxalt (generic name: Rizatriptan), 5 mg: take 1 pill early on when you suspect a migraine attack come on. You may take another pill after 2 hours, no more than 2 pills in 24 hours. Most people who take triptans do not have any serious side-effects. However, they can cause drowsiness (remember to not drive or use heavy machinery when drowsy), nausea, dizziness, dry mouth. Less common side effects include strange sensations, such as tightness in your chest or throat, tingling, flushing, and feelings of heaviness or pressure in areas such as the face, limbs, and chest. These in the chest can mimic heart related pain  (angina) and may cause alarm, but usually these sensations are not harmful or a sign of a heart attack. However, if you develop intense chest pain or sensations of discomfort, you should stop taking your medication and consult with me or your PCP or go to the nearest urgent care facility or ER or call 911.  We will do a brain scan, called MRI and call you with the test results. We will have to schedule you for this on a separate date. This test requires authorization from your insurance, and we will take care of the insurance process. I will order a home sleep test to look for signs of obstructive sleep apnea (aka OSA). As explained, the long-term risks and ramifications of untreated moderate to severe obstructive sleep apnea may include (but are not limited to): increased risk for cardiovascular disease, including congestive heart failure, stroke, difficult to control hypertension, treatment resistant obesity, arrhythmias, especially irregular heartbeat commonly known as A. Fib. (atrial fibrillation); even type 2 diabetes has been linked to untreated OSA.  I recommend you see an optometrist or ophthalmologist of your choosing for a full, dilated eye examination as you have had blurry vision and light sensitivity in the right eye more than left.  Strain on the eyes can cause headaches as well. If you have any acute or new symptoms that are worrisome, please proceed to the emergency room or call 911. Keep your appointment with your dermatologist as scheduled. We will plan a follow up in about 3 months. >>  Thank you very much for allowing me to participate in the care of this nice patient. If I can be of any further assistance to you please do not hesitate to call me at [phone].  Sincerely,   Katherine Fairy, MD, PhD

## 2023-12-09 ENCOUNTER — Encounter: Payer: Self-pay | Admitting: Neurology

## 2023-12-10 NOTE — Telephone Encounter (Signed)
 I called pt.  She states that her headaches come and go, since when last seen here.  She had optometrist see her normal study (did dilate eyes), I relayed if could fax notes to us .  She says that she has R eye numbness/pressure/warm feeling (hard to describe) constant feeling. Has taken motrin , no rizatriptan .  I told her that insurance is asking if her headache pattern has changed or the frequency of her headaches. I told her that may not approve.  Would see.

## 2023-12-10 NOTE — Telephone Encounter (Signed)
 Insurance company is asking for "Documentation of change in pattern/frequency of HA's"  Can you add something to the office note?

## 2023-12-14 ENCOUNTER — Telehealth: Payer: Self-pay | Admitting: Neurology

## 2023-12-14 NOTE — Telephone Encounter (Signed)
HST [facility] [employer] pending

## 2023-12-14 NOTE — Telephone Encounter (Signed)
 HST [employer] no auth req via fax form

## 2023-12-15 NOTE — Telephone Encounter (Signed)
 Pt reports her headaches are getting worse, she will my chart RN

## 2023-12-18 ENCOUNTER — Ambulatory Visit (INDEPENDENT_AMBULATORY_CARE_PROVIDER_SITE_OTHER): Admitting: Neurology

## 2023-12-18 DIAGNOSIS — G4733 Obstructive sleep apnea (adult) (pediatric): Secondary | ICD-10-CM | POA: Diagnosis not present

## 2023-12-18 DIAGNOSIS — R202 Paresthesia of skin: Secondary | ICD-10-CM

## 2023-12-18 DIAGNOSIS — G43909 Migraine, unspecified, not intractable, without status migrainosus: Secondary | ICD-10-CM

## 2023-12-18 DIAGNOSIS — Z9189 Other specified personal risk factors, not elsewhere classified: Secondary | ICD-10-CM

## 2023-12-18 DIAGNOSIS — R0683 Snoring: Secondary | ICD-10-CM

## 2023-12-18 DIAGNOSIS — R519 Headache, unspecified: Secondary | ICD-10-CM

## 2023-12-18 DIAGNOSIS — H538 Other visual disturbances: Secondary | ICD-10-CM

## 2024-01-05 NOTE — Telephone Encounter (Signed)
 MRI denied:  we asked your provider for the following important facts or documents: notes from your doctor that say your headaches are getting worse (more frequent or more painful). The notes that were provided say that you did a test (Brain CT (Computed Tomography)) on 12/05/2023 with normal results. We need to know how your problem has changed since then.  Without this additional information, your request did not meet criteria for approval found in [employer] Clinical Guideline 001 for Brain MRI.

## 2024-01-07 ENCOUNTER — Other Ambulatory Visit: Payer: Self-pay | Admitting: Family Medicine

## 2024-01-07 ENCOUNTER — Encounter: Payer: Self-pay | Admitting: Family Medicine

## 2024-01-07 DIAGNOSIS — R21 Rash and other nonspecific skin eruption: Secondary | ICD-10-CM

## 2024-01-07 NOTE — Progress Notes (Signed)
 See procedure note.

## 2024-01-07 NOTE — Telephone Encounter (Signed)
 Please call patient and advise her that her brain MRI was denied by her insurance.  She can follow-up as scheduled.  We can revisit the need for a brain MRI down the road.

## 2024-01-12 ENCOUNTER — Ambulatory Visit: Payer: Self-pay | Admitting: Neurology

## 2024-01-12 NOTE — Procedures (Signed)
   [facility]  HOME SLEEP TEST (SANSA) REPORT (Mail-Out Device):   STUDY DATE: 12/24/23  DOB: July 26, 2001  MRN: [id number]  ORDERING CLINICIAN: [name], MD, PhD   REFERRING CLINICIAN: Mickiel Albany, FNP   CLINICAL INFORMATION/HISTORY: 23 year old female with a benign medical history, who reports a recurrent headaches, including morning headaches.   PATIENT'S LAST REPORTED EPWORTH SLEEPINESS SCORE (ESS): 0/24.  BMI (at the time of sleep clinic visit and/or test date): 26.4 kg/m  FINDINGS:   Study Protocol:    The SANSA single-point-of-skin-contact chest-worn sensor - an FDA cleared and DOT approved type 4 home sleep test device - measures eight physiological channels,  including blood oxygen saturation (measured via PPG [photoplethysmography]), EKG-derived heart rate, respiratory effort, chest movement (measured via accelerometer), snoring, body position, and actigraphy. The device is designed to be worn for up to 10 hours per study.   Sleep Summary:   Total Recording Time (hours, min): 9 hours, 16 min  Total Effective Sleep Time (hours, min):  6 hours, 7 min  Sleep Efficiency (%):    66%   Respiratory Indices:   Calculated sAHI (per hour):  0.8/hour         Oxygen Saturation Statistics:    Oxygen Saturation (%) Mean: 96.7%   Minimum oxygen saturation (%):                 81.2%   O2 Saturation Range (%): 81.2-100%   Time below or at 88% saturation: 0 min   Pulse Rate Statistics:   Pulse Mean (bpm):    66/min    Pulse Range (54 - 105/min)   Snoring: Intermittent, mild  IMPRESSION/DIAGNOSES:   Primary snoring   RECOMMENDATIONS:   This home sleep test does not demonstrate any significant obstructive or central sleep disordered breathing with a total AHI of less than 5/hour. Her total AHI was 0.8/hour, O2 nadir of 81.2% without any significant time below or at 88% saturation for the night (<1 min). Snoring was detected, intermittently,  in the mild range. Treatment with a positive airway pressure device such as AutoPap or CPAP is not indicated based on this test. Snoring may improve with avoidance of the supine sleep position and weight loss (where clinically appropriate).   For disturbing snoring, an oral appliance through dentistry or orthodontics can be considered.  Other causes of the patient's symptoms, including circadian rhythm disturbances, an underlying mood disorder, medication effect and/or an underlying medical problem cannot be ruled out based on this test. Clinical correlation is recommended.  The patient should be cautioned not to drive, work at heights, or operate dangerous or heavy equipment when tired or sleepy. Review and reiteration of good sleep hygiene measures should be pursued with any patient. The patient will be advised to follow up with her referring provider, who will be notified of the test results.   I certify that I have reviewed the raw data recording prior to the issuance of this report in accordance with the standards of the American Academy of Sleep Medicine (AASM).    INTERPRETING PHYSICIAN:   [name], MD, PhD Medical Director, [facility]) Diplomat, ABPN (Neurology and Sleep)   [facility] [address] [place], [place] [address] [phone]

## 2024-01-14 NOTE — Telephone Encounter (Signed)
-----   Message from [name] sent at 01/12/2024  6:33 PM EDT ----- See MyChart message to patient, FYI.

## 2024-01-14 NOTE — Telephone Encounter (Signed)
 Noted

## 2024-01-21 NOTE — Telephone Encounter (Signed)
 [employer]: [id number] exp. 01/20/24-02/19/24 sent to GI [phone]

## 2024-01-25 ENCOUNTER — Encounter: Payer: Self-pay | Admitting: Neurology

## 2024-01-28 ENCOUNTER — Ambulatory Visit
Admission: RE | Admit: 2024-01-28 | Discharge: 2024-01-28 | Disposition: A | Discharge status: Home / Self Care | Source: Ambulatory Visit | Attending: Neurology | Admitting: Neurology

## 2024-01-28 DIAGNOSIS — R202 Paresthesia of skin: Secondary | ICD-10-CM

## 2024-01-28 DIAGNOSIS — G43909 Migraine, unspecified, not intractable, without status migrainosus: Secondary | ICD-10-CM | POA: Diagnosis not present

## 2024-01-28 DIAGNOSIS — R519 Headache, unspecified: Secondary | ICD-10-CM

## 2024-01-28 DIAGNOSIS — Z9189 Other specified personal risk factors, not elsewhere classified: Secondary | ICD-10-CM

## 2024-01-28 DIAGNOSIS — H538 Other visual disturbances: Secondary | ICD-10-CM

## 2024-01-28 MED ORDER — GADOPICLENOL 0.5 MMOL/ML IV SOLN
5.0000 mL | Freq: Once | INTRAVENOUS | Status: AC | PRN
  Administered 2024-01-28: 5 mL via INTRAVENOUS

## 2024-02-01 NOTE — Telephone Encounter (Signed)
 Noted

## 2024-02-01 NOTE — Telephone Encounter (Signed)
-----   Message from True Mar sent at 01/28/2024  3:49 PM EDT ----- See MyChart message to patient, FYI.  ----- Message ----- From: Rosemarie Eather RAMAN, MD Sent: 01/28/2024   3:45 PM EDT To: True Mar, MD

## 2024-02-08 ENCOUNTER — Ambulatory Visit: Admitting: Radiology

## 2024-02-25 ENCOUNTER — Ambulatory Visit (INDEPENDENT_AMBULATORY_CARE_PROVIDER_SITE_OTHER): Admitting: Obstetrics and Gynecology

## 2024-02-25 ENCOUNTER — Ambulatory Visit

## 2024-02-25 ENCOUNTER — Other Ambulatory Visit (HOSPITAL_COMMUNITY)
Admission: RE | Admit: 2024-02-25 | Discharge: 2024-02-25 | Disposition: A | Discharge status: Home / Self Care | Source: Ambulatory Visit | Attending: Obstetrics and Gynecology | Admitting: Obstetrics and Gynecology

## 2024-02-25 ENCOUNTER — Encounter: Payer: Self-pay | Admitting: Obstetrics and Gynecology

## 2024-02-25 VITALS — BP 100/59 | HR 75 | Ht <= 58 in | Wt 118.1 lb

## 2024-02-25 DIAGNOSIS — N898 Other specified noninflammatory disorders of vagina: Secondary | ICD-10-CM | POA: Diagnosis present

## 2024-02-25 DIAGNOSIS — R1031 Right lower quadrant pain: Secondary | ICD-10-CM | POA: Diagnosis not present

## 2024-02-25 DIAGNOSIS — N760 Acute vaginitis: Secondary | ICD-10-CM | POA: Insufficient documentation

## 2024-02-25 LAB — POCT URINALYSIS DIPSTICK
Bilirubin, UA: NEGATIVE
Blood, UA: NEGATIVE
Glucose, UA: NEGATIVE
Ketones, UA: NEGATIVE
Leukocytes, UA: NEGATIVE
Nitrite, UA: NEGATIVE
Odor: NEGATIVE
Protein, UA: NEGATIVE
Spec Grav, UA: 1.01 (ref 1.010–1.025)
Urobilinogen, UA: 0.2 U/dL
pH, UA: 6 (ref 5.0–8.0)
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
## 2024-02-25 NOTE — Progress Notes (Signed)
 GYN in office, reporting vaginal discharge, odor. She states that she is concerned that she may have a tampon stuck inside of her vagina that she forgot about because it has happen before. Pt reports feeling bloated, R sided pelvic  and lower back pain for the last 5 days. Last pap 07/02/2022 LMP 02/08/24

## 2024-02-26 LAB — CERVICOVAGINAL ANCILLARY ONLY
Bacterial Vaginitis (gardnerella): POSITIVE — AB
Candida Glabrata: NEGATIVE
Candida Vaginitis: NEGATIVE
Chlamydia: NEGATIVE
Comment: NEGATIVE
Comment: NEGATIVE
Comment: NEGATIVE
Comment: NEGATIVE
Comment: NEGATIVE
Comment: NORMAL
Neisseria Gonorrhea: NEGATIVE
Trichomonas: NEGATIVE

## 2024-02-27 ENCOUNTER — Encounter: Payer: Self-pay | Admitting: Family Medicine

## 2024-02-27 ENCOUNTER — Encounter

## 2024-02-27 ENCOUNTER — Ambulatory Visit: Payer: Self-pay | Admitting: Obstetrics and Gynecology

## 2024-02-27 MED ORDER — METRONIDAZOLE 0.75 % VA GEL
1.0000 | Freq: Every day | VAGINAL | 0 refills | Status: DC
Start: 2024-02-27 — End: 2024-03-21

## 2024-03-01 ENCOUNTER — Other Ambulatory Visit: Payer: Self-pay | Admitting: Obstetrics and Gynecology

## 2024-03-01 DIAGNOSIS — R102 Pelvic and perineal pain: Secondary | ICD-10-CM

## 2024-03-03 ENCOUNTER — Ambulatory Visit: Admitting: Obstetrics

## 2024-03-03 ENCOUNTER — Ambulatory Visit: Payer: Medicaid Other | Admitting: Dermatology

## 2024-03-04 ENCOUNTER — Ambulatory Visit (HOSPITAL_COMMUNITY): Admission: RE | Admit: 2024-03-04 | Source: Ambulatory Visit

## 2024-03-04 ENCOUNTER — Other Ambulatory Visit (HOSPITAL_COMMUNITY): Payer: Self-pay | Admitting: Obstetrics and Gynecology

## 2024-03-04 ENCOUNTER — Ambulatory Visit (HOSPITAL_COMMUNITY)
Admission: RE | Admit: 2024-03-04 | Discharge: 2024-03-04 | Disposition: A | Discharge status: Home / Self Care | Source: Ambulatory Visit | Attending: Obstetrics and Gynecology | Admitting: Obstetrics and Gynecology

## 2024-03-04 ENCOUNTER — Ambulatory Visit (HOSPITAL_COMMUNITY)

## 2024-03-04 DIAGNOSIS — R102 Pelvic and perineal pain: Secondary | ICD-10-CM | POA: Insufficient documentation

## 2024-03-07 ENCOUNTER — Other Ambulatory Visit: Payer: Self-pay | Admitting: Medical Genetics

## 2024-03-08 ENCOUNTER — Ambulatory Visit: Admitting: Neurology

## 2024-03-08 ENCOUNTER — Encounter: Payer: Self-pay | Admitting: Neurology

## 2024-03-12 ENCOUNTER — Ambulatory Visit: Payer: Self-pay | Admitting: Obstetrics and Gynecology

## 2024-03-12 DIAGNOSIS — R102 Pelvic and perineal pain: Secondary | ICD-10-CM

## 2024-03-12 DIAGNOSIS — N83202 Unspecified ovarian cyst, left side: Secondary | ICD-10-CM

## 2024-03-16 ENCOUNTER — Other Ambulatory Visit (HOSPITAL_COMMUNITY)

## 2024-03-16 NOTE — Telephone Encounter (Signed)
 Called patient to schedule and she said she has found a pcp closer to her home an thank you.

## 2024-03-18 ENCOUNTER — Other Ambulatory Visit: Payer: Self-pay

## 2024-03-18 DIAGNOSIS — N83202 Unspecified ovarian cyst, left side: Secondary | ICD-10-CM

## 2024-03-18 DIAGNOSIS — R102 Pelvic and perineal pain: Secondary | ICD-10-CM

## 2024-03-21 ENCOUNTER — Other Ambulatory Visit: Payer: Self-pay

## 2024-03-21 MED ORDER — METRONIDAZOLE 500 MG PO TABS: 500.0000 mg | ORAL_TABLET | Freq: Two times a day (BID) | ORAL | 0 refills | Status: DC

## 2024-03-21 MED ORDER — METRONIDAZOLE 0.75 % VA GEL: 1.0000 | Freq: Every day | VAGINAL | 0 refills | Status: DC

## 2024-03-30 ENCOUNTER — Ambulatory Visit: Admitting: Obstetrics and Gynecology

## 2024-03-30 DIAGNOSIS — Z01419 Encounter for gynecological examination (general) (routine) without abnormal findings: Secondary | ICD-10-CM

## 2024-03-30 DIAGNOSIS — Z124 Encounter for screening for malignant neoplasm of cervix: Secondary | ICD-10-CM

## 2024-03-30 NOTE — Progress Notes (Incomplete)
   Subjective:     Katherine Watts is a 23 y.o. female here at [facility] for a routine exam.  Current complaints: ***.  Personal and family health history reviewed: {yes/no:9010}.  Do you have a primary care provider? *** Do you feel safe at home? ***  Flowsheet Row Office Visit from 09/08/2023 in [facility] at [facility]  PHQ-2 Total Score 1    Health Maintenance Due  Topic Date Due   HPV VACCINES (1 - 3-dose series) Never done   Meningococcal B Vaccine (1 of 2 - Standard) Never done   DTaP/Tdap/Td (1 - Tdap) Never done   Hepatitis B Vaccines 19-59 Average Risk (1 of 3 - 19+ 3-dose series) Never done   COVID-19 Vaccine (1 - 2024-25 season) Never done   INFLUENZA VACCINE  03/11/2024     Risk factors for chronic health problems: Smoking: Alchohol/how much: Pt BMI: There is no height or weight on file to calculate BMI.   Gynecologic History No LMP recorded. Contraception: {method:5051} Last Pap: 07/02/22. Results were: normal Last mammogram: N/A. Results were: N/A  Obstetric History OB History  Gravida Para Term Preterm AB Living  0 0 0 0 0 0  SAB IAB Ectopic Multiple Live Births  0 0 0 0 0     {Common ambulatory SmartLinks:19316}  Review of Systems {ros; complete:30496}    Objective:   There were no vitals filed for this visit. There is no height or weight on file to calculate BMI.  VS reviewed, nursing note reviewed,  Constitutional: well developed, well nourished, no distress HEENT: normocephalic, thyroid without enlargement or mass HEART: RRR, no murmurs rubs/gallops RESP: clear and equal to auscultation bilaterally in all lobes  Breast Exam:  ***Deferred with low risks and shared decision making, discussed recommendation to start mammogram between 40-50 yo/ exam performed: right breast normal without mass, skin or nipple changes or axillary nodes, left breast normal without mass, skin or nipple changes or axillary nodes Abdomen:  soft Neuro: alert and oriented x 3 Skin: warm, dry Psych: affect normal Pelvic exam: ***Deferred/ Performed: Cervix pink, visually closed, without lesion, scant white creamy discharge, vaginal walls and external genitalia normal Bimanual exam: Cervix 0/long/high, firm, anterior, neg CMT, uterus nontender, nonenlarged, adnexa without tenderness, enlargement, or mass        Assessment/Plan:   1. Well woman exam (Primary) ***  2. Cervical cancer screening ***      No follow-ups on file.   Derrek JINNY Freund, RN 7:58 AM

## 2024-04-08 ENCOUNTER — Other Ambulatory Visit: Payer: Self-pay

## 2024-04-08 ENCOUNTER — Encounter (HOSPITAL_COMMUNITY): Payer: Self-pay | Admitting: Obstetrics & Gynecology

## 2024-04-08 ENCOUNTER — Inpatient Hospital Stay (HOSPITAL_COMMUNITY)

## 2024-04-08 ENCOUNTER — Inpatient Hospital Stay (HOSPITAL_COMMUNITY)
Admission: AD | Admit: 2024-04-08 | Discharge: 2024-04-08 | Disposition: A | Discharge status: Home / Self Care | Attending: Obstetrics & Gynecology | Admitting: Obstetrics & Gynecology

## 2024-04-08 DIAGNOSIS — M545 Low back pain, unspecified: Secondary | ICD-10-CM | POA: Insufficient documentation

## 2024-04-08 DIAGNOSIS — R140 Abdominal distension (gaseous): Secondary | ICD-10-CM | POA: Diagnosis not present

## 2024-04-08 DIAGNOSIS — O3680X1 Pregnancy with inconclusive fetal viability, fetus 1: Secondary | ICD-10-CM

## 2024-04-08 DIAGNOSIS — N8311 Corpus luteum cyst of right ovary: Secondary | ICD-10-CM | POA: Diagnosis not present

## 2024-04-08 DIAGNOSIS — R1031 Right lower quadrant pain: Secondary | ICD-10-CM | POA: Insufficient documentation

## 2024-04-08 DIAGNOSIS — O3481 Maternal care for other abnormalities of pelvic organs, first trimester: Secondary | ICD-10-CM | POA: Insufficient documentation

## 2024-04-08 DIAGNOSIS — O26891 Other specified pregnancy related conditions, first trimester: Secondary | ICD-10-CM | POA: Insufficient documentation

## 2024-04-08 DIAGNOSIS — O26851 Spotting complicating pregnancy, first trimester: Secondary | ICD-10-CM | POA: Insufficient documentation

## 2024-04-08 DIAGNOSIS — Z3A01 Less than 8 weeks gestation of pregnancy: Secondary | ICD-10-CM | POA: Diagnosis not present

## 2024-04-08 DIAGNOSIS — O3680X0 Pregnancy with inconclusive fetal viability, not applicable or unspecified: Secondary | ICD-10-CM | POA: Diagnosis present

## 2024-04-08 DIAGNOSIS — R188 Other ascites: Secondary | ICD-10-CM | POA: Insufficient documentation

## 2024-04-08 LAB — POCT PREGNANCY, URINE: Preg Test, Ur: POSITIVE — AB

## 2024-04-08 LAB — CBC
HCT: 39.8 % (ref 36.0–46.0)
Hemoglobin: 13.2 g/dL (ref 12.0–15.0)
MCH: 29.1 pg (ref 26.0–34.0)
MCHC: 33.2 g/dL (ref 30.0–36.0)
MCV: 87.9 fL (ref 80.0–100.0)
Platelets: 223 K/uL (ref 150–400)
RBC: 4.53 MIL/uL (ref 3.87–5.11)
RDW: 12 % (ref 11.5–15.5)
WBC: 6 K/uL (ref 4.0–10.5)
nRBC: 0 % (ref 0.0–0.2)

## 2024-04-08 LAB — ABO/RH: ABO/RH(D): A POS

## 2024-04-08 LAB — WET PREP, GENITAL
Clue Cells Wet Prep HPF POC: NONE SEEN
Sperm: NONE SEEN
Trich, Wet Prep: NONE SEEN
WBC, Wet Prep HPF POC: >=10 — AB (ref <10)
Yeast Wet Prep HPF POC: NONE SEEN

## 2024-04-08 LAB — HCG, QUANTITATIVE, PREGNANCY: hCG, Beta Chain, Quant, S: 587 m[IU]/mL — ABNORMAL HIGH (ref <5)

## 2024-04-08 NOTE — MAU Provider Note (Signed)
 Chief Complaint:  Abdominal Pain and Vaginal Bleeding   HPI    Katherine Watts is a 23 y.o. G1P0000 at [redacted]w[redacted]d who presents to maternity admissions reporting 1 month of RLQ cramping and bloating, as well as 2 episodes of spotting. She states that she has been following with her provider for RLQ cramping and bloating. She recently had an ultrasound showing a left ovarian cyst, but she did not believe that that explains her symptoms.  About 2 weeks ago, the pain got worse and she noticed her period was 4 days late.  She then had a positive home pregnancy test.  She has had 2 episodes of dark brown spotting, one episode 2 days ago and one today.    She also notes increased lower right back pain that she believes is related to her history of kyphosis, increased headaches, and nausea and diarrhea.  She denies any chest pain, shortness of breath, vomiting or constipation.  Pregnancy Course:  She recently had a home pregnancy test, but has not connected to a provider for ongoing prenatal care.  Past Medical History:  Diagnosis Date   Headache    Kyphosis 30-Jul-2000   OB History  Gravida Para Term Preterm AB Living  1 0 0 0 0 0  SAB IAB Ectopic Multiple Live Births  0 0 0 0 0    # Outcome Date GA Lbr Len/2nd Weight Sex Type Anes PTL Lv  1 Current            Past Surgical History:  Procedure Laterality Date   SPINE SURGERY     Family History  Problem Relation Age of Onset   Seizures Sister    Headache Neg Hx    Migraines Neg Hx    Social History   Tobacco Use   Smoking status: Never    Passive exposure: Never   Smokeless tobacco: Never  Vaping Use   Vaping status: Never Used  Substance Use Topics   Alcohol use: Not Currently    Comment: socially   Drug use: Never   No Known Allergies No medications prior to admission.    I have reviewed patient's Past Medical Hx, Surgical Hx, Family Hx, Social Hx, medications and allergies.   ROS  Pertinent items noted in HPI and  remainder of comprehensive ROS otherwise negative.   PHYSICAL EXAM  Patient Vitals for the past 24 hrs:  BP Temp Temp src Pulse Resp SpO2 Height Weight  04/08/24 1717 108/62 98.8 F (37.1 C) Oral 92 18 100 % -- --  04/08/24 1711 -- -- -- -- -- -- 4' 10 (1.473 m) 54 kg    Constitutional: Well-developed, well-nourished female in no acute distress.  Cardiovascular: normal rate & rhythm, warm and well-perfused Respiratory: normal effort, no problems with respiration noted GI: Abd soft, non-distended, TTP in RLQ MS: Extremities nontender, no edema, normal ROM Neurologic: Alert and oriented x 4.  GU: no CVA tenderness Pelvic: deferred       Labs: Results for orders placed or performed during the hospital encounter of 04/08/24 (from the past 24 hours)  Pregnancy, urine POC     Status: Abnormal   Collection Time: 04/08/24  5:37 PM  Result Value Ref Range   Preg Test, Ur POSITIVE (A) NEGATIVE  ABO/Rh     Status: None   Collection Time: 04/08/24  5:56 PM  Result Value Ref Range   ABO/RH(D) A POS    No rh immune globuloin      NOT A  RH IMMUNE GLOBULIN CANDIDATE, PT RH POSITIVE Performed at [facility], [address] [place], [place] [address]   CBC     Status: None   Collection Time: 04/08/24  5:57 PM  Result Value Ref Range   WBC 6.0 4.0 - 10.5 K/uL   RBC 4.53 3.87 - 5.11 MIL/uL   Hemoglobin 13.2 12.0 - 15.0 g/dL   HCT 60.1 63.9 - 53.9 %   MCV 87.9 80.0 - 100.0 fL   MCH 29.1 26.0 - 34.0 pg   MCHC 33.2 30.0 - 36.0 g/dL   RDW 87.9 88.4 - 84.4 %   Platelets 223 150 - 400 K/uL   nRBC 0.0 0.0 - 0.2 %  hCG, quantitative, pregnancy     Status: Abnormal   Collection Time: 04/08/24  5:57 PM  Result Value Ref Range   hCG, Beta Chain, Quant, S 587 (H) <5 mIU/mL  Wet prep, genital     Status: Abnormal   Collection Time: 04/08/24  6:15 PM   Specimen: Vaginal  Result Value Ref Range   Yeast Wet Prep HPF POC NONE SEEN NONE SEEN   Trich, Wet Prep NONE SEEN NONE SEEN   Clue  Cells Wet Prep HPF POC NONE SEEN NONE SEEN   WBC, Wet Prep HPF POC >=10 (A) <10   Sperm NONE SEEN     Imaging:  US  OB LESS THAN 14 WEEKS WITH OB TRANSVAGINAL Result Date: 04/08/2024 CLINICAL DATA:  Initial evaluation for acute vaginal bleeding, early pregnancy. EXAM: OBSTETRIC <14 WK US  AND TRANSVAGINAL OB US  TECHNIQUE: Both transabdominal and transvaginal ultrasound examinations were performed for complete evaluation of the gestation as well as the maternal uterus, adnexal regions, and pelvic cul-de-sac. Transvaginal technique was performed to assess early pregnancy. COMPARISON:  None Available. FINDINGS: Intrauterine gestational sac: Negative. Yolk sac:  Negative Embryo:  Negative Cardiac Activity: Negative Heart Rate: N/A Subchorionic hemorrhage:  None visualized. Maternal uterus/adnexae: Left ovary within normal limits. 1.5 cm complex cyst positioned along the margin of the right ovary with peripherally increased vascularity. This is favored to be intra-ovarian, and most characteristic of a degenerating corpus luteal cyst. Small volume free fluid within the pelvis. No other adnexal mass. IMPRESSION: 1. Early pregnancy with no discrete IUP or adnexal mass identified. Finding is consistent with a pregnancy of unknown anatomic location. Differential considerations include IUP to early to visualize, recent SAB, or possibly occult ectopic pregnancy. Close clinical monitoring with serial beta HCGs and close interval follow-up ultrasound recommended as clinically warranted. 2. 1.5 cm right ovarian corpus luteal cyst with associated small volume free fluid within the pelvis. Electronically Signed   By: Morene Hoard M.D.   On: 04/08/2024 18:47    MDM & MAU COURSE  MDM: Moderate  MAU Course: Orders Placed This Encounter  Procedures   Wet prep, genital   US  OB LESS THAN 14 WEEKS WITH OB TRANSVAGINAL   CBC   hCG, quantitative, pregnancy   Progesterone   Diet NPO time specified   Pregnancy,  urine POC   ABO/Rh   Discharge patient   No orders of the defined types were placed in this encounter.  Presenting with 2 weeks of increased right lower quadrant pain in comparison to her baseline over the past month, as well as 2 episodes of dark brown spotting.  Vital signs stable and exam unremarkable.  Blood work showing quant hCG of 587.  Ultrasound showing right ovarian corpus luteal cyst, no discrete IUP or adnexal mass identified.  Discussed results with patient. Differential diagnosis at this time includes early IUP, early SAB, ectopic pregnancy, or pain in relation to ovarian cyst.  Plan at this time is to repeat quant hCG in 4 days to determine next steps.  Patient is not currently established with an OB provider.  Given the holiday weekend and the need to follow-up immediately afterwards, decision was made to have patient follow-up at MAU on Tuesday, 9/3 for repeat lab work.   All questions answered, patient in agreement with plan.  ASSESSMENT   1. [redacted] weeks gestation of pregnancy   2. Pregnancy of unknown anatomic location     PLAN  Discharge home in stable condition with return precautions with plan to follow-up on Tuesday, 9/2 for repeat hCG.     Follow-up Information     [facility]. Go on 04/12/2024.   Specialty: Obstetrics and Gynecology Why: for repeat bloodwork Contact information: [address] [place]  [phone]                Allergies as of 04/08/2024   No Known Allergies      Medication List     STOP taking these medications    BIOTIN PO   CALCIUM PO   D3 + K2 PO   ibuprofen  800 MG tablet Commonly known as: ADVIL    IRON-C PO   rizatriptan  5 MG tablet Commonly known as: MAXALT    VITAMIN D PO   ZINC OXIDE PO       TAKE these medications    metroNIDAZOLE  500 MG tablet Commonly known as: FLAGYL  Take 1 tablet (500 mg total) by mouth 2 (two) times daily.        Charlie Courts, MD  Family Medicine - Obstetrics Fellow

## 2024-04-08 NOTE — Discharge Instructions (Signed)
 You were seen because of abdominal cramping and spotting.  We did a pregnancy blood test to track the amount of hormone in your blood.  Based on this blood test and your ultrasound, we recommend coming back to get your blood drawn on Tuesday, 9/2 in the evening to repeat the blood test.  At that time, we will discuss the results and next steps.

## 2024-04-08 NOTE — MAU Note (Signed)
 Katherine Watts is a 23 y.o. at Unknown here in MAU reporting: she's having intermittent cramping for the past 2 months, but has been constant for the past few days. States had a +HPT 2 days ago.  States she also has been spotting intermittently for the past few days.  LMP: 03/05/2024 Onset of complaint: 2 days ago Pain score: 5 Vitals:   04/08/24 1717  BP: 108/62  Pulse: 92  Resp: 18  Temp: 98.8 F (37.1 C)  SpO2: 100%     FHT: NA  Lab orders placed from triage: UPT

## 2024-04-12 ENCOUNTER — Encounter

## 2024-04-12 ENCOUNTER — Other Ambulatory Visit: Payer: Self-pay

## 2024-04-12 ENCOUNTER — Encounter (HOSPITAL_COMMUNITY): Payer: Self-pay | Admitting: Obstetrics and Gynecology

## 2024-04-12 ENCOUNTER — Inpatient Hospital Stay (HOSPITAL_COMMUNITY): Admit: 2024-04-12 | Discharge: 2024-04-12 | Disposition: A | Discharge status: Home / Self Care

## 2024-04-12 ENCOUNTER — Inpatient Hospital Stay (HOSPITAL_COMMUNITY)
Admission: AD | Admit: 2024-04-12 | Discharge: 2024-04-12 | Disposition: A | Discharge status: Home / Self Care | Payer: Self-pay | Attending: Obstetrics and Gynecology | Admitting: Obstetrics and Gynecology

## 2024-04-12 DIAGNOSIS — Z3A01 Less than 8 weeks gestation of pregnancy: Secondary | ICD-10-CM | POA: Diagnosis not present

## 2024-04-12 DIAGNOSIS — Z3200 Encounter for pregnancy test, result unknown: Secondary | ICD-10-CM | POA: Diagnosis present

## 2024-04-12 DIAGNOSIS — Z3A14 14 weeks gestation of pregnancy: Secondary | ICD-10-CM | POA: Insufficient documentation

## 2024-04-12 DIAGNOSIS — O3680X0 Pregnancy with inconclusive fetal viability, not applicable or unspecified: Secondary | ICD-10-CM | POA: Diagnosis not present

## 2024-04-12 LAB — HCG, QUANTITATIVE, PREGNANCY: hCG, Beta Chain, Quant, S: 1941 m[IU]/mL — ABNORMAL HIGH (ref <5)

## 2024-04-12 LAB — GC/CHLAMYDIA PROBE AMP (~~LOC~~) NOT AT ARMC
Chlamydia: NEGATIVE
Comment: NEGATIVE
Comment: NORMAL
Neisseria Gonorrhea: NEGATIVE

## 2024-04-12 NOTE — MAU Note (Signed)
 Katherine Watts is a 23 y.o. at [redacted]w[redacted]d here in MAU reporting: she's here for repeat blood work.  Denies VB, has mild cramping  LMP: 03/05/2024 Onset of complaint: ongoing Pain score: 4 Vitals:   04/12/24 1840  BP: 102/65  Pulse: 72  Resp: 18  Temp: 98.4 F (36.9 C)  SpO2: 100%     FHT: NA  Lab orders placed from triage: HCG

## 2024-04-12 NOTE — Progress Notes (Signed)
 WRitten and verbal d/c instructions given and pt voiced understanding

## 2024-04-12 NOTE — MAU Provider Note (Signed)
 History   Chief Complaint:  HCG Level   Katherine Watts is  23 y.o. G1P0000 Patient's last menstrual period was 03/05/2024.Katherine Watts Patient is here for follow up of quantitative HCG and ongoing surveillance of pregnancy status. She is [redacted]w[redacted]d weeks gestation by LMP.    Since her last visit, the patient is without new complaint. The patient reports bleeding as  none now.  She reports cramping that is still the same as it was 4 days ago.  She receives GYN care with [facility]; next appt is 04/15/2024 for f/u pelvic U/S.   General ROS:  positive for RT side pelvic cramping  Her previous Quantitative HCG values are:  Recent Labs  Lab 04/08/24 1757  HCGBETAQNT 587*    Physical Exam   Blood pressure 102/65, pulse 72, temperature 98.4 F (36.9 C), temperature source Oral, resp. rate 18, height 4' 10 (1.473 m), weight 53.9 kg, last menstrual period 03/05/2024, SpO2 100%.  Focused Gynecological Exam: examination not indicated  Labs: Results for orders placed or performed during the hospital encounter of 04/12/24 (from the past 24 hours)  hCG, quantitative, pregnancy   Collection Time: 04/12/24  6:51 PM  Result Value Ref Range   hCG, Beta Chain, Quant, S 1,941 (H) <5 mIU/mL    Ultrasound Studies:   US  OB LESS THAN 14 WEEKS WITH OB TRANSVAGINAL Result Date: 04/08/2024 CLINICAL DATA:  Initial evaluation for acute vaginal bleeding, early pregnancy. EXAM: OBSTETRIC <14 WK US  AND TRANSVAGINAL OB US  TECHNIQUE: Both transabdominal and transvaginal ultrasound examinations were performed for complete evaluation of the gestation as well as the maternal uterus, adnexal regions, and pelvic cul-de-sac. Transvaginal technique was performed to assess early pregnancy. COMPARISON:  None Available. FINDINGS: Intrauterine gestational sac: Negative. Yolk sac:  Negative Embryo:  Negative Cardiac Activity: Negative Heart Rate: N/A Subchorionic hemorrhage:  None visualized. Maternal uterus/adnexae: Left ovary within  normal limits. 1.5 cm complex cyst positioned along the margin of the right ovary with peripherally increased vascularity. This is favored to be intra-ovarian, and most characteristic of a degenerating corpus luteal cyst. Small volume free fluid within the pelvis. No other adnexal mass. IMPRESSION: 1. Early pregnancy with no discrete IUP or adnexal mass identified. Finding is consistent with a pregnancy of unknown anatomic location. Differential considerations include IUP to early to visualize, recent SAB, or possibly occult ectopic pregnancy. Close clinical monitoring with serial beta HCGs and close interval follow-up ultrasound recommended as clinically warranted. 2. 1.5 cm right ovarian corpus luteal cyst with associated small volume free fluid within the pelvis. Electronically Signed   By: Morene Hoard M.D.   On: 04/08/2024 18:47    Assessment:   1. Pregnancy of unknown anatomic location   2. [redacted] weeks gestation of pregnancy      Plan: -Discharge home in stable condition -Will need to cancel pelvic U/S on 04/15/2024 -Advised there is a 230.7% increase in HCG, but would like to have 1 more HCG drawn in 2 days to see if the HCG doubles.  -If the HCG level doubles, will need a repeat U/S in 2 weeks -Ectopic pregnancy precautions given -Patient advised to follow-up with [facility] in 2 days and then repeat U/S in 2 weeks -Patient may return to MAU as needed or if her condition were to change or worsen -Patient verbalized an understanding of the plan of care and agrees.   Ala Cart, CNM 04/12/2024, 8:23 PM

## 2024-04-12 NOTE — Discharge Instructions (Signed)
 I've given you instructions on ectopic pregnancy, so you know what signs to look for. This does NOT mean you have an ectopic pregnancy.

## 2024-04-14 ENCOUNTER — Other Ambulatory Visit

## 2024-04-14 DIAGNOSIS — O3680X0 Pregnancy with inconclusive fetal viability, not applicable or unspecified: Secondary | ICD-10-CM

## 2024-04-15 ENCOUNTER — Inpatient Hospital Stay (HOSPITAL_COMMUNITY)
Admission: AD | Admit: 2024-04-15 | Discharge: 2024-04-16 | Disposition: A | Discharge status: Home / Self Care | Payer: Self-pay | Attending: Obstetrics and Gynecology | Admitting: Obstetrics and Gynecology

## 2024-04-15 ENCOUNTER — Ambulatory Visit: Payer: Self-pay | Admitting: Obstetrics and Gynecology

## 2024-04-15 ENCOUNTER — Ambulatory Visit (HOSPITAL_COMMUNITY)

## 2024-04-15 DIAGNOSIS — Z3A01 Less than 8 weeks gestation of pregnancy: Secondary | ICD-10-CM | POA: Diagnosis not present

## 2024-04-15 DIAGNOSIS — O26891 Other specified pregnancy related conditions, first trimester: Secondary | ICD-10-CM | POA: Diagnosis not present

## 2024-04-15 DIAGNOSIS — O26899 Other specified pregnancy related conditions, unspecified trimester: Secondary | ICD-10-CM

## 2024-04-15 DIAGNOSIS — R109 Unspecified abdominal pain: Secondary | ICD-10-CM | POA: Insufficient documentation

## 2024-04-15 DIAGNOSIS — R1030 Lower abdominal pain, unspecified: Secondary | ICD-10-CM | POA: Diagnosis not present

## 2024-04-15 LAB — BETA HCG QUANT (REF LAB): hCG Quant: 1754 m[IU]/mL

## 2024-04-15 NOTE — MAU Note (Signed)
 Pt says she was here last Friday - for cramping. Had labs and U/S .  Was here on Tuesday - for labs . Went to [facility] [address]  for labs on Thursday . Thinks early preg or ectopic .  Here tonight bc Dr called with results- told to come here . Pt feels cramping - 5/10- no meds  No VB

## 2024-04-16 ENCOUNTER — Inpatient Hospital Stay (HOSPITAL_COMMUNITY)

## 2024-04-16 DIAGNOSIS — O26891 Other specified pregnancy related conditions, first trimester: Secondary | ICD-10-CM

## 2024-04-16 DIAGNOSIS — Z3A01 Less than 8 weeks gestation of pregnancy: Secondary | ICD-10-CM

## 2024-04-16 DIAGNOSIS — R1030 Lower abdominal pain, unspecified: Secondary | ICD-10-CM

## 2024-04-16 LAB — HCG, QUANTITATIVE, PREGNANCY: hCG, Beta Chain, Quant, S: 3392 m[IU]/mL — ABNORMAL HIGH (ref <5)

## 2024-04-16 NOTE — MAU Provider Note (Signed)
 History     CSN: [id number]  Arrival date and time: 04/15/24 2216   Event Date/Time   First Provider Initiated Contact with Patient 04/16/24 0022      Chief Complaint  Patient presents with   Abdominal Pain    Katherine Watts is a 23 y.o. G1P0 at [redacted]w[redacted]d who receives care at [facility].  She presents today for cramping.  She states the pain has been present for weeks, but is worse tonight.  She states it is located on the right side and is worse with incline walking.  She denies relieving factors.  She rates the pain a 4/10.  She expresses concern for ectopic pregnancy.    OB History     Gravida  1   Para  0   Term  0   Preterm  0   AB  0   Living  0      SAB  0   IAB  0   Ectopic  0   Multiple  0   Live Births  0           Past Medical History:  Diagnosis Date   Headache    Kyphosis 08/24/99    Past Surgical History:  Procedure Laterality Date   SPINE SURGERY      Family History  Problem Relation Age of Onset   Seizures Sister    Headache Neg Hx    Migraines Neg Hx     Social History   Tobacco Use   Smoking status: Never    Passive exposure: Never   Smokeless tobacco: Never  Vaping Use   Vaping status: Never Used  Substance Use Topics   Alcohol use: Not Currently    Comment: socially   Drug use: Never    Allergies: No Known Allergies  No medications prior to admission.    Review of Systems  Gastrointestinal:  Positive for abdominal pain and diarrhea (Loose). Negative for nausea and vomiting.  Genitourinary:  Negative for difficulty urinating, dysuria, vaginal bleeding and vaginal discharge.   Physical Exam   Blood pressure (!) 95/49, pulse 78, temperature 98.1 F (36.7 C), temperature source Oral, resp. rate 12, height 4' 9 (1.448 m), weight 53.8 kg, last menstrual period 03/05/2024.  Physical Exam Vitals reviewed.  Constitutional:      Appearance: She is well-developed.  HENT:     Head: Normocephalic and  atraumatic.  Eyes:     Conjunctiva/sclera: Conjunctivae normal.  Cardiovascular:     Rate and Rhythm: Normal rate.  Pulmonary:     Effort: Pulmonary effort is normal. No respiratory distress.  Musculoskeletal:     Cervical back: Normal range of motion.  Neurological:     Mental Status: She is alert and oriented to person, place, and time.  Psychiatric:        Mood and Affect: Mood normal.        Behavior: Behavior normal.     MAU Course  Procedures Results for orders placed or performed during the hospital encounter of 04/15/24 (from the past 24 hours)  hCG, quantitative, pregnancy     Status: Abnormal   Collection Time: 04/15/24 11:11 PM  Result Value Ref Range   hCG, Beta Chain, Quant, S 3,392 (H) <5 mIU/mL   US  OB Transvaginal Result Date: 04/16/2024 EXAM: OBSTETRIC ULTRASOUND FIRST TRIMESTER TECHNIQUE: Transvaginal first trimester obstetric pelvic duplex ultrasound was performed with real-time imaging, color flow Doppler imaging, and spectral analysis. COMPARISON: Comparison is made with ultrasound dated 04/08/2024. CLINICAL  HISTORY: Cramping. FINDINGS: UTERUS: No focal myometrial mass. GESTATIONAL SAC(S): Single normal appearing intrauterine gestational sac with probable tiny yolk sac. The yolk sac measures 2 mm. No subchorionic hemorrhage. YOLK SAC: Present, measures 2 mm. EMBRYO(<11WK) /FETUS(>=11WK): No embryo or cardiac activity was visualized. CROWN RUMP LENGTH: Not measured. RATE OF CARDIAC ACTIVITY: Not measured. RIGHT OVARY: Normal. LEFT OVARY: 1.5 cm cystic lesion in the left ovary is presumably a corpus luteum cyst . FREE FLUID: Trace free fluid in the pelvis. MEASUREMENTS ESTIMATED GESTATIONAL AGE BY CURRENT ULTRASOUND: 5 weeks 3 days, based on mean sac diameter of 6.6 mm. IMPRESSION: 1. Probable Intrauterine gestational sac with tiny yolk sac. No embryo or cardiac activity visualized. Findings remain consistent with pregnancy of unknown location. Differential considerations  include early intrauterine pregnancy, abnormal IUP, or occult ectopic pregnancy. Continued serial beta HCG and clinical monitoring is recommended. 2. Presumed corpus luteum cyst in the left ovary measuring 1.5 cm. Electronically signed by: Norman Gatlin MD 04/16/2024 12:54 AM EDT RP Workstation: [id number]     MDM Labs: hCG Ultrasound Assessment and Plan  23 year old G1P0 at 6.0 weeks Abdominal Cramping  -Reviewed POC with patient. -Exam performed and findings discussed.  -Patient offered and declines pain medication. -Informed that hCG level returned increased, significantly, from previous findings that was declining.  -Reviewed recommendation for US  and patient agreeable. -Informed that some cramping, in early pregnancy, can be normal anticipated symptom considering growing uterus.  -US  order placed. Await results.   Harlene LITTIE Duncans 04/16/2024, 12:22 AM   Reassessment (12:56 AM) -Results as above. -Informed that GS and YS seen which is suggestive of viable pregnancy. -Discussed repeat US  in 2-3 weeks for viability. -Precautions reviewed. -Encouraged to call primary office or return to MAU if symptoms worsen or with the onset of new symptoms. -Discharged to home in stable condition.  Harlene LITTIE Duncans MSN, CNM Advanced Practice Provider, [facility] [employer] [facility]

## 2024-04-28 ENCOUNTER — Ambulatory Visit: Admitting: Dermatology

## 2024-04-28 ENCOUNTER — Encounter: Payer: Self-pay | Admitting: Dermatology

## 2024-04-28 VITALS — BP 84/55 | HR 87

## 2024-04-28 DIAGNOSIS — L639 Alopecia areata, unspecified: Secondary | ICD-10-CM | POA: Diagnosis not present

## 2024-04-28 MED ORDER — FLUOCINONIDE 0.05 % EX SOLN: 1.0000 | Freq: Two times a day (BID) | CUTANEOUS | 6 refills | Status: AC

## 2024-04-28 NOTE — Patient Instructions (Addendum)
 Date: Thu Apr 28 2024  Hello Katherine Watts,  Thank you for visiting today. Here is a summary of the key instructions:  - Medications:   - Use Lidex  solution for hair loss patches in the third trimester   - Apply for 2 weeks, then stop for 2 weeks   - Repeat until hair grows back   - Use Vanicream HC for skin care  - Other Instructions:   - Find ways to reduce stress through meditation and exercise   - Avoid using new products during pregnancy, especially in the first trimester   - Collect samples of Vanicream products   - Use the provided coupon for Vanicream sunscreen  Please reach out if you have any questions or concerns.  Warm regards,  Dr. Delon Lenis [facility]    Important Information   Due to recent changes in healthcare laws, you may see results of your pathology and/or laboratory studies on MyChart before the doctors have had a chance to review them. We understand that in some cases there may be results that are confusing or concerning to you. Please understand that not all results are received at the same time and often the doctors may need to interpret multiple results in order to provide you with the best plan of care or course of treatment. Therefore, we ask that you please give us  2 business days to thoroughly review all your results before contacting the office for clarification. Should we see a critical lab result, you will be contacted sooner.     If You Need Anything After Your Visit   If you have any questions or concerns for your doctor, please call our main line at [phone]. If no one answers, please leave a voicemail as directed and we will return your call as soon as possible. Messages left after 4 pm will be answered the following business day.    You may also send us  a message via MyChart. We typically respond to MyChart messages within 1-2 business days.  For prescription refills, please ask your pharmacy to contact our office. Our fax number is  [phone].  If you have an urgent issue when the clinic is closed that cannot wait until the next business day, you can page your doctor at the number below.     Please note that while we do our best to be available for urgent issues outside of office hours, we are not available 24/7.    If you have an urgent issue and are unable to reach us , you may choose to seek medical care at your doctor's office, retail clinic, urgent care center, or emergency room.   If you have a medical emergency, please immediately call 911 or go to the emergency department. In the event of inclement weather, please call our main line at [phone] for an update on the status of any delays or closures.  Dermatology Medication Tips: Please keep the boxes that topical medications come in in order to help keep track of the instructions about where and how to use these. Pharmacies typically print the medication instructions only on the boxes and not directly on the medication tubes.   If your medication is too expensive, please contact our office at [phone] or send us  a message through MyChart.    We are unable to tell what your co-pay for medications will be in advance as this is different depending on your insurance coverage. However, we may be able to find a substitute medication at lower cost  or fill out paperwork to get insurance to cover a needed medication.    If a prior authorization is required to get your medication covered by your insurance company, please allow us  1-2 business days to complete this process.   Drug prices often vary depending on where the prescription is filled and some pharmacies may offer cheaper prices.   The website www.goodrx.com contains coupons for medications through different pharmacies. The prices here do not account for what the cost may be with help from insurance (it may be cheaper with your insurance), but the website can give you the price if you did not use any  insurance.  - You can print the associated coupon and take it with your prescription to the pharmacy.  - You may also stop by our office during regular business hours and pick up a GoodRx coupon card.  - If you need your prescription sent electronically to a different pharmacy, notify our office through [facility] or by phone at [phone]

## 2024-04-28 NOTE — Progress Notes (Signed)
   New Patient Visit   Subjective  Katherine Watts is a 23 y.o. female who presents for the following: Hair Loss (Alopecia Areata)  Patient states she has hair loss located at the scalp that she would like to have examined. Patient reports the areas have been there for several years. She reports the areas are bothersome.Patient reports she has previously been treated for these areas. She reports she was seen by a dermatologist in the past that diagnosed her with Alopecia Areata but was not prescribed any medications. Patient denies Hx of bx. Patient denies family history of hair loss or thinning. Patient reports she is actively pregnant or trying to conceive. She reports she is currently [redacted] weeks pregnant.  The following portions of the chart were reviewed this encounter and updated as appropriate: medications, allergies, medical history  Review of Systems:  No other skin or systemic complaints except as noted in HPI or Assessment and Plan.  Objective  Well appearing patient in no apparent distress; mood and affect are within normal limits.  A focused examination was performed of the following areas: Scalp  Relevant exam findings are noted in the Assessment and Plan.         Assessment & Plan    1. Alopecia Areata - Assessment: Patient has history of alopecia areata diagnosed at age 25 with current patchy hair loss on sides, particularly noticeable when hair is in ponytail. Patches are completely bare and smooth. Condition exacerbated by stress which patient reports experiencing frequently. Pregnancy discovered 3 weeks ago expected to potentially improve condition due to natural immunosuppression during gestation.  - Plan:    Lidex  solution for use in third trimester if needed - apply for 2 weeks on, 2 weeks off until hair regrows    Provide samples and coupon for Vanicream [facility] products    Recommend stress management techniques such as meditation and exercise    Educate  patient on nature of alopecia areata and its relationship with immune system    Discuss potential improvement during pregnancy due to natural immunosuppression    Avoid use of medications during first trimester due to fetal development concerns    Consider intralesional steroid injections for severe patches in the future (not during pregnancy)   Return for Follow up after giving birth and done Breast Feeding.  I, Jetta Ager, am acting as Neurosurgeon for [employer], DO.  Documentation: I have reviewed the above documentation for accuracy and completeness, and I agree with the above.  Delon Lenis, DO

## 2024-04-30 ENCOUNTER — Inpatient Hospital Stay (HOSPITAL_COMMUNITY)
Admission: AD | Admit: 2024-04-30 | Discharge: 2024-04-30 | Disposition: A | Discharge status: Home / Self Care | Attending: Obstetrics and Gynecology | Admitting: Obstetrics and Gynecology

## 2024-04-30 ENCOUNTER — Encounter (HOSPITAL_COMMUNITY): Payer: Self-pay | Admitting: Obstetrics and Gynecology

## 2024-04-30 ENCOUNTER — Inpatient Hospital Stay (HOSPITAL_COMMUNITY)

## 2024-04-30 DIAGNOSIS — N939 Abnormal uterine and vaginal bleeding, unspecified: Secondary | ICD-10-CM | POA: Diagnosis not present

## 2024-04-30 DIAGNOSIS — O021 Missed abortion: Secondary | ICD-10-CM

## 2024-04-30 DIAGNOSIS — Z3A01 Less than 8 weeks gestation of pregnancy: Secondary | ICD-10-CM

## 2024-04-30 DIAGNOSIS — O020 Blighted ovum and nonhydatidiform mole: Secondary | ICD-10-CM

## 2024-04-30 HISTORY — DX: Unspecified ovarian cyst, unspecified side: N83.209

## 2024-04-30 LAB — URINALYSIS, ROUTINE W REFLEX MICROSCOPIC
Bilirubin Urine: NEGATIVE
Glucose, UA: NEGATIVE mg/dL
Ketones, ur: NEGATIVE mg/dL
Leukocytes,Ua: NEGATIVE
Nitrite: NEGATIVE
Protein, ur: NEGATIVE mg/dL
Specific Gravity, Urine: 1.025 (ref 1.005–1.030)
pH: 6 (ref 5.0–8.0)

## 2024-04-30 LAB — WET PREP, GENITAL
Sperm: NONE SEEN
Trich, Wet Prep: NONE SEEN
WBC, Wet Prep HPF POC: >=10 — AB (ref <10)
Yeast Wet Prep HPF POC: NONE SEEN

## 2024-04-30 LAB — CBC
HCT: 36.4 % (ref 36.0–46.0)
Hemoglobin: 12.1 g/dL (ref 12.0–15.0)
MCH: 29.2 pg (ref 26.0–34.0)
MCHC: 33.2 g/dL (ref 30.0–36.0)
MCV: 87.9 fL (ref 80.0–100.0)
Platelets: 210 K/uL (ref 150–400)
RBC: 4.14 MIL/uL (ref 3.87–5.11)
RDW: 12.4 % (ref 11.5–15.5)
WBC: 5.9 K/uL (ref 4.0–10.5)
nRBC: 0 % (ref 0.0–0.2)

## 2024-04-30 MED ORDER — OXYCODONE HCL 5 MG PO CAPS: 10.0000 mg | ORAL_CAPSULE | Freq: Four times a day (QID) | ORAL | 0 refills | Status: AC | PRN

## 2024-04-30 MED ORDER — MISOPROSTOL 200 MCG PO TABS: 800.0000 ug | ORAL_TABLET | Freq: Once | ORAL | 0 refills | Status: AC

## 2024-04-30 MED ORDER — ONDANSETRON HCL 4 MG PO TABS: 8.0000 mg | ORAL_TABLET | Freq: Three times a day (TID) | ORAL | 0 refills | Status: AC | PRN

## 2024-04-30 NOTE — MAU Provider Note (Signed)
 S Ms. Katherine Watts is a 23 y.o. G1P0000 patient who presents to MAU today with complaint of reporting that she is about [redacted] weeks pregnant stating that she has been having lower abdominal cramping the past couple of weeks.  Last night she had some pinkish spotting that became more reddish as the night went on.  She is still having lower abdominal cramping and light vaginal bleeding.  Patient denies any urinary complaints and offers no complaints of vaginal burning, irritation, or itching at this time.  Patient was seen on 04/16/2024 for similar complaints and ultrasound at that time showed a single appearing gestational sac with a possible tiny yolk sac and gestational sac mean diameter was 6.6 mm.  The remainder of the ROS is negative unless otherwise noted in HPI   O BP 105/62 (BP Location: Right Arm)   Pulse 92   Temp 98.8 F (37.1 C) (Oral)   Resp 16   Ht 4' 9 (1.448 m)   Wt 54.6 kg   LMP 03/05/2024   SpO2 100%   BMI 26.05 kg/m  Physical Exam Vitals and nursing note reviewed.  Constitutional:      General: She is not in acute distress.    Appearance: Normal appearance. She is well-developed. She is not ill-appearing.  HENT:     Head: Normocephalic.     Mouth/Throat:     Mouth: Mucous membranes are moist.  Cardiovascular:     Rate and Rhythm: Normal rate.  Pulmonary:     Effort: Pulmonary effort is normal.  Abdominal:     Palpations: Abdomen is soft.     Tenderness: There is no abdominal tenderness.  Musculoskeletal:        General: Normal range of motion.     Cervical back: Normal range of motion.  Skin:    General: Skin is warm.  Neurological:     Mental Status: She is alert and oriented to person, place, and time.  Psychiatric:        Mood and Affect: Mood normal.        Behavior: Behavior normal.     MDM  HIGH  Vaginal bleeding in early pregnacy CBC: NM  H/H ABO: A Positive OB Ultrasound ( See Report below c/w blighted Ovum /MAB)  UA: no  evidence of UTI Wet prep : Clue cells positive ( Will defer treatment at this time as patient is actively bleeding and will be taking Cytotec  for MAB)  - Discussed with OB Attending ( Dr Elvera) will offer patient expectant management vs medical management and counseling provided with S/R/B of both explained to the patient )  Differential diagnosis considered for 1st trimester vaginal bleeding includes but is not limited to: ectopic pregnancy, complete spontaneous abortion, incomplete abortion, missed abortion, threatened abortion, embryonic/fetal demise, cervical insufficiency, cervical or vaginal disorder    We discussed that miscarriage is common with ~1/4 of women experiencing it in their lifetime. We discussed that there is nothing you did or did not do to cause this. We reviewed most common reason is presumed to be genetic abnormalities that allow a pregnancy to start but not continue past an early stage, but realistically we do not know the cause in most cases. We reviewed that studies show no definite difference between attempting another pregnancy sooner vs waiting, though some studies do show better live birth outcomes with trying sooner. We reviewed options of expectant, medical, or surgical management. After counseling you elected for medical management. I have sent a  prescription for misoprostol  (medicine to open your cervix and cause contractions), zofran  (nausea medicine), and 4 tabs of 5mg  Oxycodone  (strong opioid pain medicine). We reviewed that cramping and bleeding are normal in the first few hours after taking the medication, but should eventually wane.. We discussed return precautions including crescendo abdominal pain, heavy vaginal bleeding soaking >1 pad/hour, and fever    Attestation regarding controlled substance review:  I performed a 12 month review in the [place]  Controlled Substance Review System ([place] CSRS) prior to prescribing any Schedule II or Schedule III Opiates  or narcotic per the requirement of the STOP ACT.  If there was a technical or electronic difficulty, I reviewed the [place] CSRS after this issue was resolved.    Orders Placed This Encounter  Procedures   Wet prep, genital    Standing Status:   Standing    Number of Occurrences:   1   US  OB Transvaginal    Standing Status:   Standing    Number of Occurrences:   1    Symptom/Reason for Exam:   Vaginal bleeding [209288]   CBC    Standing Status:   Standing    Number of Occurrences:   1   Urinalysis, Routine w reflex microscopic -Urine, Random    Standing Status:   Standing    Number of Occurrences:   1    Specimen Source:   Urine, Random [244]   Discharge patient Discharge disposition: 01-Home or Self Care; Discharge patient date: 04/30/2024    Standing Status:   Standing    Number of Occurrences:   1    Discharge disposition:   01-Home or Self Care [1]    Discharge patient date:   04/30/2024      Results for orders placed or performed during the hospital encounter of 04/30/24 (from the past 24 hours)  Wet prep, genital     Status: Abnormal   Collection Time: 04/30/24 11:50 AM  Result Value Ref Range   Yeast Wet Prep HPF POC NONE SEEN NONE SEEN   Trich, Wet Prep NONE SEEN NONE SEEN   Clue Cells Wet Prep HPF POC PRESENT (A) NONE SEEN   WBC, Wet Prep HPF POC >=10 (A) <10   Sperm NONE SEEN   Urinalysis, Routine w reflex microscopic -Urine, Random     Status: Abnormal   Collection Time: 04/30/24 11:50 AM  Result Value Ref Range   Color, Urine YELLOW YELLOW   APPearance HAZY (A) CLEAR   Specific Gravity, Urine 1.025 1.005 - 1.030   pH 6.0 5.0 - 8.0   Glucose, UA NEGATIVE NEGATIVE mg/dL   Hgb urine dipstick SMALL (A) NEGATIVE   Bilirubin Urine NEGATIVE NEGATIVE   Ketones, ur NEGATIVE NEGATIVE mg/dL   Protein, ur NEGATIVE NEGATIVE mg/dL   Nitrite NEGATIVE NEGATIVE   Leukocytes,Ua NEGATIVE NEGATIVE   RBC / HPF 0-5 0 - 5 RBC/hpf   WBC, UA 0-5 0 - 5 WBC/hpf   Bacteria, UA RARE (A)  NONE SEEN   Squamous Epithelial / HPF 0-5 0 - 5 /HPF   Mucus PRESENT   CBC     Status: None   Collection Time: 04/30/24 11:59 AM  Result Value Ref Range   WBC 5.9 4.0 - 10.5 K/uL   RBC 4.14 3.87 - 5.11 MIL/uL   Hemoglobin 12.1 12.0 - 15.0 g/dL   HCT 63.5 63.9 - 53.9 %   MCV 87.9 80.0 - 100.0 fL   MCH 29.2 26.0 - 34.0 pg  MCHC 33.2 30.0 - 36.0 g/dL   RDW 87.5 88.4 - 84.4 %   Platelets 210 150 - 400 K/uL   nRBC 0.0 0.0 - 0.2 %    Narrative & Impression  CLINICAL DATA:  [id number] Vaginal bleeding [id number].   EXAM: TRANSVAGINAL OB ULTRASOUND   TECHNIQUE: Transvaginal ultrasound was performed for complete evaluation of the gestation as well as the maternal uterus, adnexal regions, and pelvic cul-de-sac.   COMPARISON:  Ultrasound from 04/16/2024   FINDINGS: Intrauterine gestational sac: Single   Yolk sac:  Not Visualized.   Embryo:  Not Visualized.   MSD: 12.6 mm   6 w   1 d.   The gestational sac measured up to 6.6 mm on the prior exam from 04/16/2024.   Subchorionic hemorrhage:  None visualized.   Maternal uterus/adnexae: Bilateral ovaries are within normal limits.   IMPRESSION: *Single intrauterine gestational sac measuring up to 12.6 mm on today's exam. The gestational sac measured up to 6.6 mm on the prior exam from 04/16/2024. Considering absence of embryo more than 2 weeks after a scan that showed a gestational sac, findings suggest definitely nonviable pregnancy/blighted ovum (an embryonic pregnancy). Correlate clinically and with quantitative beta hCG level.     Electronically Signed   By: Ree Molt M.D.   On: 04/30/2024 13:55      I have reviewed the patient chart and performed the physical exam . I have ordered & interpreted the lab results and reviewed and interpreted the Ultrasound images and agree with the radiologist findings Medications ordered as stated below.  A/P as described below.  Counseling and education provided and patient agreeable   with plan as described below. Verbalized understanding.    ASSESSMENT Medical screening exam complete Blighted ovum  Vaginal bleeding  Missed abortion    PLAN Future Appointments  Date Time Provider Department Center  05/11/2024  3:10 PM Delores Nidia CROME, FNP [facility] None    Discharge from MAU in stable condition  Will contact office to change appointment on 05/11/24 to a miscarriage f/u ( Message sent to the office)  See AVS for full description of educational information and instructions provided to the patient at time of discharge   Warning signs for worsening condition that would warrant emergency follow-up discussed Patient may return to MAU as needed   Littie Olam LABOR, NP 04/30/2024 3:07 PM   This chart was dictated using voice recognition software, Dragon. Despite the best efforts of this provider to proofread and correct errors, errors may still occur which can change documentation meaning.

## 2024-04-30 NOTE — MAU Note (Addendum)
 Katherine Watts is a 23 y.o. at [redacted]w[redacted]d here in MAU reporting: yesterday afternoon started having some cramping.  Last night had some pink spotting, has continued, seems to be a little more, has been light in amt per picture- not wearing liner or pad.  Is dk pinkish brown. Still cramping, random and very mild. Some burning with urination.  Onset of complaint: yesterday afternoon Pain score: very mild Vitals:   04/30/24 1131  BP: 105/62  Pulse: 92  Resp: 16  Temp: 98.8 F (37.1 C)  SpO2: 100%      Lab orders placed from triage:  urine/vag swabs

## 2024-05-02 LAB — GC/CHLAMYDIA PROBE AMP (~~LOC~~) NOT AT ARMC
Chlamydia: NEGATIVE
Comment: NEGATIVE
Comment: NORMAL
Neisseria Gonorrhea: NEGATIVE

## 2024-05-03 ENCOUNTER — Encounter

## 2024-05-06 ENCOUNTER — Telehealth: Payer: Self-pay | Admitting: Lactation Services

## 2024-05-06 NOTE — Telephone Encounter (Signed)
 Called patient to let her know Medication has been approved and can pick up at Pharmacy. Patient did not answer. LM that medication is being prepared and can pick up at pharmacy.

## 2024-05-06 NOTE — Telephone Encounter (Signed)
 Alfonso Moulder (Key: [id number]) Rx #: [id number] Need Help? Call us  at [phone] Status New (Not sent to plan) Drug oxyCODONE  HCl 5MG  capsules ePA cloud logo Form Per[employer] Medicaid Electronic Prior Authorization Form Original Claim Info 75 Call [phone]. For a 3 day temporary supply, submit DUR PPS Level of Service Code 03. Opioid Subject to Safety Limits, Day Supply Exceeded. Pharmacist, ask member or caregiver if they have nalox  Return call to Pharmacy to try above recommendation- Spoke with Signe and he reports the Rx did go through.

## 2024-05-06 NOTE — Telephone Encounter (Signed)
 Received PA request for Oxycodone  from Pharmacy. Called and spoke with pharmacy and they report that they are receiving message that Plan limits exceeded. Pharmacist reports this often comes up when a patient has had Oxy in the recent past. Per Chart review, no other administrations are noted.   Called patient, she did not answer. Lm for her to call the office at [phone] about a medication and to also check her Mychart message.

## 2024-05-10 ENCOUNTER — Encounter: Admitting: Advanced Practice Midwife

## 2024-05-11 ENCOUNTER — Other Ambulatory Visit (HOSPITAL_COMMUNITY)
Admission: RE | Admit: 2024-05-11 | Discharge: 2024-05-11 | Disposition: A | Discharge status: Home / Self Care | Source: Ambulatory Visit | Attending: Obstetrics and Gynecology | Admitting: Obstetrics and Gynecology

## 2024-05-11 ENCOUNTER — Encounter: Payer: Self-pay | Admitting: Obstetrics and Gynecology

## 2024-05-11 ENCOUNTER — Ambulatory Visit: Admitting: Obstetrics and Gynecology

## 2024-05-11 VITALS — BP 90/57 | HR 72 | Ht <= 58 in | Wt 116.0 lb

## 2024-05-11 DIAGNOSIS — O020 Blighted ovum and nonhydatidiform mole: Secondary | ICD-10-CM

## 2024-05-11 DIAGNOSIS — Z5189 Encounter for other specified aftercare: Secondary | ICD-10-CM

## 2024-05-11 DIAGNOSIS — O039 Complete or unspecified spontaneous abortion without complication: Secondary | ICD-10-CM

## 2024-05-11 DIAGNOSIS — R300 Dysuria: Secondary | ICD-10-CM | POA: Insufficient documentation

## 2024-05-11 DIAGNOSIS — N898 Other specified noninflammatory disorders of vagina: Secondary | ICD-10-CM | POA: Diagnosis present

## 2024-05-11 MED ORDER — NITROFURANTOIN MONOHYD MACRO 100 MG PO CAPS: 100.0000 mg | ORAL_CAPSULE | Freq: Two times a day (BID) | ORAL | 0 refills | Status: AC

## 2024-05-11 NOTE — Progress Notes (Signed)
   GYNECOLOGY PROGRESS NOTE  History:  23 y.o. G1P0000 presents to [facility] for SAB follow up. Was seen in the hospital 96 and 9/20 diagnosed with miscarriage. She completed cytotec  had significant cramping. She stopped bleeding 3 days ago.   Reports dysuria and urinary frequency and odor with urine.   The following portions of the patient's history were reviewed and updated as appropriate: allergies, current medications, past family history, past medical history, past social history, past surgical history and problem list. Last pap smear on 07/02/22 was normal  Health Maintenance Due  Topic Date Due   HPV VACCINES (1 - 3-dose series) Never done   Meningococcal B Vaccine (1 of 2 - Standard) Never done   DTaP/Tdap/Td (1 - Tdap) Never done   Hepatitis B Vaccines 19-59 Average Risk (1 of 3 - 19+ 3-dose series) Never done   Influenza Vaccine  03/11/2024   COVID-19 Vaccine (1 - 2024-25 season) Never done     Review of Systems:  Pertinent items are noted in HPI.   Objective:  Physical Exam Blood pressure (!) 90/57, pulse 72, height 4' 10 (1.473 m), weight 116 lb (52.6 kg), last menstrual period 03/05/2024, unknown if currently breastfeeding. VS reviewed, nursing note reviewed,  Constitutional: well developed, well nourished, no distress HEENT: normocephalic Pulm/chest wall: normal effort Abdomen: soft Neuro: alert and oriented  Skin: warm, dry Psych: affect normal Pelvic exam: deferred  COMPARISON:  Ultrasound from 04/16/2024 FINDINGS: Intrauterine gestational sac: Single   Yolk sac:  Not Visualized.   Embryo:  Not Visualized.   MSD: 12.6 mm   6 w   1 d. The gestational sac measured up to 6.6 mm on the prior exam from 04/16/2024. Subchorionic hemorrhage:  None visualized. Maternal uterus/adnexae: Bilateral ovaries are IMPRESSION: *Single intrauterine gestational sac measuring up to 12.6 mm on today's exam. The gestational sac measured up to 6.6 mm on the prior exam  from 04/16/2024. Considering absence of embryo more than 2 weeks after a scan that showed a gestational sac, findings suggest definitely nonviable pregnancy/blighted ovum (an embryonic pregnancy). Correlate clinically and with quantitative beta hCG level.  Assessment & Plan:  1. Follow-up visit after miscarriage (Primary) 2. Blighted ovum 3. Spontaneous miscarriage Offered condolences  Discussed miscarriage and future pregnancies, does not desire pregnancy, does not desire bc at this time Will do hcg, left before blood work will schedule lab visit    4. Dysuria Discussed symptomatic tx while awaiting culture - Cervicovaginal ancillary only( [place]) - nitrofurantoin, macrocrystal-monohydrate, (MACROBID) 100 MG capsule; Take 1 capsule (100 mg total) by mouth 2 (two) times daily for 5 days.  Dispense: 10 capsule; Refill: 0  5. Vaginal discharge Reassess for bv discussed probiotics  Will discussed extended treatment if recurrent  - Cervicovaginal ancillary only( [place])   Nidia Daring, FNP

## 2024-05-11 NOTE — Addendum Note (Signed)
 Addended by: ALVIA ROSINA GAILS on: 05/11/2024 04:54 PM   Modules accepted: Orders

## 2024-05-11 NOTE — Progress Notes (Signed)
 Pt presents for SAB f/u  Denies bleeding and pain  Pt does not wan to conceive in the near future. Declines BC   Pt c/o burning with urination. Pt has concerns about BV Dx from hospital. Did not receive treatment.

## 2024-05-12 ENCOUNTER — Ambulatory Visit: Payer: Self-pay | Admitting: Obstetrics and Gynecology

## 2024-05-12 LAB — CERVICOVAGINAL ANCILLARY ONLY
Bacterial Vaginitis (gardnerella): NEGATIVE
Candida Glabrata: NEGATIVE
Candida Vaginitis: POSITIVE — AB
Comment: NEGATIVE
Comment: NEGATIVE
Comment: NEGATIVE

## 2024-05-12 MED ORDER — FLUCONAZOLE 150 MG PO TABS: 150.0000 mg | ORAL_TABLET | Freq: Once | ORAL | 1 refills | Status: AC

## 2024-05-20 ENCOUNTER — Other Ambulatory Visit: Payer: Self-pay | Admitting: Medical Genetics

## 2024-05-20 DIAGNOSIS — Z006 Encounter for examination for normal comparison and control in clinical research program: Secondary | ICD-10-CM

## 2024-06-01 ENCOUNTER — Ambulatory Visit: Admitting: Dermatology
